# Patient Record
Sex: Male | Born: 1990 | Race: White | Hispanic: No | Marital: Single | Smoking: Former smoker
Health system: Southern US, Community
[De-identification: ages and names within clinical notes are randomized; demographics above are authoritative.]

## PROBLEM LIST (undated history)

## (undated) DIAGNOSIS — F1910 Other psychoactive substance abuse, uncomplicated: Secondary | ICD-10-CM

## (undated) DIAGNOSIS — S6990XA Unspecified injury of unspecified wrist, hand and finger(s), initial encounter: Secondary | ICD-10-CM

## (undated) DIAGNOSIS — L700 Acne vulgaris: Secondary | ICD-10-CM

## (undated) DIAGNOSIS — F17200 Nicotine dependence, unspecified, uncomplicated: Secondary | ICD-10-CM

## (undated) DIAGNOSIS — F411 Generalized anxiety disorder: Secondary | ICD-10-CM

## (undated) DIAGNOSIS — J309 Allergic rhinitis, unspecified: Secondary | ICD-10-CM

## (undated) DIAGNOSIS — K589 Irritable bowel syndrome without diarrhea: Secondary | ICD-10-CM

## (undated) DIAGNOSIS — B2790 Infectious mononucleosis, unspecified without complication: Secondary | ICD-10-CM

## (undated) DIAGNOSIS — K8020 Calculus of gallbladder without cholecystitis without obstruction: Secondary | ICD-10-CM

## (undated) DIAGNOSIS — N200 Calculus of kidney: Secondary | ICD-10-CM

## (undated) DIAGNOSIS — M5412 Radiculopathy, cervical region: Secondary | ICD-10-CM

## (undated) DIAGNOSIS — T7840XA Allergy, unspecified, initial encounter: Secondary | ICD-10-CM

## (undated) DIAGNOSIS — M5136 Other intervertebral disc degeneration, lumbar region: Secondary | ICD-10-CM

## (undated) DIAGNOSIS — J4530 Mild persistent asthma, uncomplicated: Secondary | ICD-10-CM

## (undated) HISTORY — DX: Acne vulgaris: L70.0

## (undated) HISTORY — DX: Nicotine dependence, unspecified, uncomplicated: F17.200

## (undated) HISTORY — DX: Generalized anxiety disorder: F41.1

## (undated) HISTORY — DX: Infectious mononucleosis, unspecified without complication: B27.90

## (undated) HISTORY — DX: Other intervertebral disc degeneration, lumbar region: M51.36

## (undated) HISTORY — DX: Irritable bowel syndrome, unspecified: K58.9

## (undated) HISTORY — DX: Allergy, unspecified, initial encounter: T78.40XA

## (undated) HISTORY — DX: Calculus of gallbladder without cholecystitis without obstruction: K80.20

## (undated) HISTORY — DX: Other psychoactive substance abuse, uncomplicated: F19.10

## (undated) HISTORY — DX: Allergic rhinitis, unspecified: J30.9

## (undated) HISTORY — DX: Radiculopathy, cervical region: M54.12

## (undated) HISTORY — DX: Mild persistent asthma, uncomplicated: J45.30

## (undated) HISTORY — DX: Unspecified injury of unspecified wrist, hand and finger(s), initial encounter: S69.90XA

## (undated) HISTORY — DX: Calculus of kidney: N20.0

## (undated) HISTORY — PX: LAPAROSCOPIC CHOLECYSTECTOMY: SUR755

---
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
## 2012-01-19 ENCOUNTER — Ambulatory Visit (INDEPENDENT_AMBULATORY_CARE_PROVIDER_SITE_OTHER): Payer: Managed Care, Other (non HMO) | Admitting: Family Medicine

## 2012-01-19 ENCOUNTER — Encounter: Payer: Self-pay | Admitting: Family Medicine

## 2012-01-19 VITALS — BP 118/78 | HR 71 | Temp 98.4°F | Ht 71.0 in | Wt 155.0 lb

## 2012-01-19 DIAGNOSIS — Z7689 Persons encountering health services in other specified circumstances: Secondary | ICD-10-CM | POA: Insufficient documentation

## 2012-01-19 DIAGNOSIS — Z23 Encounter for immunization: Secondary | ICD-10-CM

## 2012-01-19 DIAGNOSIS — Z7189 Other specified counseling: Secondary | ICD-10-CM

## 2012-01-19 NOTE — Addendum Note (Signed)
Addended by: Luisa Dago on: 01/19/2012 02:23 PM   Modules accepted: Orders

## 2012-01-19 NOTE — Assessment & Plan Note (Signed)
No acute problems. Discussed past diagnoses: stable.   Discussed warning signs of acute biliary colic--pt currently with asymptomatic gallstones.

## 2012-01-19 NOTE — Progress Notes (Signed)
Office Note 01/19/2012  CC:  Chief Complaint  Patient presents with  . Establish Care    TDAP, Flu shot    HPI:  Brian Mills. is a 21 y.o. White male who is here to establish care. Patient's most recent primary MD: [facility] in [place]. Old records in EPIC/HL were reviewed prior to or during today's visit.  He had his first episode of kidney stones 09/2011 and saw a urologist twice afterwards to get x-rays and document stone passage.  Urine strainer was given but nothing was caught.  No blood testing done.    On and off wrist extensor region pain, usually lasts a few days.  No swelling or redness or heat to wrists. Pt right handed.  Sometimes this happens on right and sometimes on left, never at same time. Sometimes gets shoulder pains, onset after he started lifting wts.   Past Medical History  Diagnosis Date  . Nephrolithiasis 09/2011    ED visit; left 3mm ureteral stone with slight hydroureter, right sided nonobstructing stone.  . Cholelithiasis     asymptomatic    History reviewed. No pertinent past surgical history.  Family History  Problem Relation Age of Onset  . Hypertension Maternal Grandfather   . Arthritis Maternal Grandfather     History   Social History  . Marital Status: Single    Spouse Name: N/A    Number of Children: N/A  . Years of Education: N/A   Occupational History  . Not on file.   Social History Main Topics  . Smoking status: Current Every Day Smoker  . Smokeless tobacco: Current User    Types: Chew  . Alcohol Use: Yes  . Drug Use: No  . Sexually Active: Not on file   Other Topics Concern  . Not on file   Social History Narrative   Single, no children.  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  +Dip.Alcohol: occ beer.No hx of drug use/abuse.    Outpatient Encounter Prescriptions as of 01/19/2012  Medication Sig Dispense Refill  . Nutritional  Supplements (HIGH-PROTEIN NUTRITIONAL SHAKE PO) Take by mouth daily.      Marland Kitchen DISCONTD: promethazine (PHENERGAN) 25 MG tablet Take 1 tablet (25 mg total) by mouth every 6 (six) hours as needed for nausea.  10 tablet  0  . DISCONTD: Tamsulosin HCl (FLOMAX) 0.4 MG CAPS Take 1 capsule (0.4 mg total) by mouth daily.  15 capsule  0    No Known Allergies  ROS Review of Systems  Constitutional: Negative for fever, chills, appetite change and fatigue.  HENT: Negative for ear pain, congestion, sore throat, neck stiffness and dental problem.   Eyes: Negative for discharge, redness and visual disturbance.  Respiratory: Negative for cough, chest tightness, shortness of breath and wheezing.   Cardiovascular: Negative for chest pain, palpitations and leg swelling.  Gastrointestinal: Negative for nausea, vomiting, abdominal pain, diarrhea and blood in stool.  Genitourinary: Negative for dysuria, urgency, frequency, hematuria, flank pain and difficulty urinating.  Musculoskeletal: Negative for myalgias, back pain, joint swelling and arthralgias.  Skin: Negative for pallor and rash.  Neurological: Negative for dizziness, speech difficulty, weakness and headaches.  Hematological: Negative for adenopathy. Does not bruise/bleed easily.  Psychiatric/Behavioral: Negative for confusion and disturbed wake/sleep cycle. The patient is not nervous/anxious.     PE; Blood pressure 118/78, pulse 71, temperature 98.4 F (36.9 C), temperature source Temporal, height 5\' 11"  (1.803 m), weight 155 lb (  70.308 kg), SpO2 99.00%. Gen: Alert, well appearing.  Patient is oriented to person, place, time, and situation. ZOX:WRUE: no injection, icteris, swelling, or exudate.  EOMI, PERRLA. Nose: no drainage or turbinate edema/swelling.  No injection or focal lesion.  Mouth: lips without lesion/swelling.  Oral mucosa pink and moist.  Dentition intact and without obvious caries or gingival swelling.  Oropharynx without erythema,  exudate, or swelling.  Neck - No masses or thyromegaly or limitation in range of motion CV: RRR, no m/r/g.   LUNGS: CTA bilat, nonlabored resps, good aeration in all lung fields. ABD: soft, NT, ND, BS normal.  No hepatospenomegaly or mass.  No bruits. EXT: no clubbing, cyanosis, or edema.   Pertinent labs:  None today  ASSESSMENT AND PLAN:   Encounter to establish care with new doctor No acute problems. Discussed past diagnoses: stable.   Discussed warning signs of acute biliary colic--pt currently with asymptomatic gallstones.  Tdap and flu vaccine given IM today.  An After Visit Summary was printed and given to the patient.  Return for at your convenience for CPE.

## 2012-06-18 ENCOUNTER — Emergency Department (HOSPITAL_COMMUNITY)
Admission: EM | Admit: 2012-06-18 | Discharge: 2012-06-18 | Disposition: A | Discharge status: Home / Self Care | Payer: Managed Care, Other (non HMO) | Attending: Emergency Medicine | Admitting: Emergency Medicine

## 2012-06-18 ENCOUNTER — Encounter (HOSPITAL_COMMUNITY): Payer: Self-pay | Admitting: *Deleted

## 2012-06-18 DIAGNOSIS — Z87442 Personal history of urinary calculi: Secondary | ICD-10-CM | POA: Insufficient documentation

## 2012-06-18 DIAGNOSIS — K439 Ventral hernia without obstruction or gangrene: Secondary | ICD-10-CM | POA: Insufficient documentation

## 2012-06-18 DIAGNOSIS — F17200 Nicotine dependence, unspecified, uncomplicated: Secondary | ICD-10-CM | POA: Insufficient documentation

## 2012-06-18 DIAGNOSIS — Z8719 Personal history of other diseases of the digestive system: Secondary | ICD-10-CM | POA: Insufficient documentation

## 2012-06-18 MED ORDER — HYDROCODONE-ACETAMINOPHEN 5-325 MG PO TABS: 1.0000 | ORAL_TABLET | ORAL | Status: DC | PRN

## 2012-06-18 MED ORDER — ONDANSETRON 4 MG PO TBDP
4.0000 mg | ORAL_TABLET | Freq: Once | ORAL | Status: AC
  Administered 2012-06-18: 4 mg via ORAL
  Filled 2012-06-18: qty 1

## 2012-06-18 MED ORDER — HYDROCODONE-ACETAMINOPHEN 5-325 MG PO TABS
1.0000 | ORAL_TABLET | Freq: Once | ORAL | Status: AC
  Administered 2012-06-18: 1 via ORAL
  Filled 2012-06-18: qty 1

## 2012-06-18 MED ORDER — ONDANSETRON HCL 4 MG PO TABS: 4.0000 mg | ORAL_TABLET | Freq: Four times a day (QID) | ORAL | Status: DC

## 2012-06-18 NOTE — ED Notes (Signed)
Patient is alert and oriented x3.  He was given DC instructions and follow up visit instructions.  Patient gave verbal understanding.  He was DC ambulatory under his own power to home.  V/S stable.  He was not showing any signs of distress on DC 

## 2012-06-18 NOTE — ED Provider Notes (Signed)
History     CSN: [id number]  Arrival date & time 06/18/12  0005   First MD Initiated Contact with Patient 06/18/12 0133      Chief Complaint  Patient presents with  . Abdominal Pain    (Consider location/radiation/quality/duration/timing/severity/associated sxs/prior treatment) HPI She presents to the emergency department for evaluation of abdominal pain. He says he was working out at the jam when he felt an immediate pop in his stomach went down to his knees. He says that he noticed a bulging coming out of the left abdominal wall about the size of golf ball. His friend told him to lie down flat. When EMS came to get him when they got him up the lump had gone away and most of the pain has significantly gone away. He came to the ED because he still having low back pain and he was not sure what was going on but thinks it was most likely a hernia. He says that this has never happened to him before. He is in no acute distress his vital signs are stable   Past Medical History  Diagnosis Date  . Nephrolithiasis 09/2011    ED visit; left 3mm ureteral stone with slight hydroureter, right sided nonobstructing stone.  . Cholelithiasis     asymptomatic    History reviewed. No pertinent past surgical history.  Family History  Problem Relation Age of Onset  . Hypertension Maternal Grandfather   . Arthritis Maternal Grandfather     History  Substance Use Topics  . Smoking status: Current Every Day Smoker  . Smokeless tobacco: Current User    Types: Chew  . Alcohol Use: Yes      Review of Systems  Review of Systems  Gen: no weight loss, fevers, chills, night sweats  Eyes: no discharge or drainage, no occular pain or visual changes  Nose: no epistaxis or rhinorrhea  Mouth: no dental pain, no sore throat  Neck: no neck pain  Lungs:No wheezing, coughing or hemoptysis CV: no chest pain, palpitations, dependent edema or orthopnea  Abd: + abdominal pain, no nausea, vomiting  GU: no  dysuria or gross hematuria  MSK:  No abnormalities  Neuro: no headache, no focal neurologic deficits  Skin: no abnormalities Psyche: negative.   Allergies  Review of patient's allergies indicates no known allergies.  Home Medications   Current Outpatient Rx  Name  Route  Sig  Dispense  Refill  . Nutritional Supplements (HIGH-PROTEIN NUTRITIONAL SHAKE PO)   Oral   Take 120 mLs by mouth 2 (two) times daily. Pre workout Shake         . HYDROcodone-acetaminophen (NORCO/VICODIN) 5-325 MG per tablet   Oral   Take 1 tablet by mouth every 4 (four) hours as needed for pain.   8 tablet   0   . ondansetron (ZOFRAN) 4 MG tablet   Oral   Take 1 tablet (4 mg total) by mouth every 6 (six) hours.   12 tablet   0     BP 156/82  Pulse 67  Temp(Src) 98.7 F (37.1 C)  Resp 20  SpO2 100%  Physical Exam  Nursing note and vitals reviewed. Constitutional: He appears well-developed and well-nourished. No distress.  HENT:  Head: Normocephalic and atraumatic.  Eyes: Pupils are equal, round, and reactive to light.  Neck: Normal range of motion. Neck supple.  Cardiovascular: Normal rate and regular rhythm.   Pulmonary/Chest: Effort normal.  Abdominal: Soft.    Neurological: He is alert.  Skin:  Skin is warm and dry.    ED Course  Procedures (including critical care time)  Labs Reviewed - No data to display No results found.   1. Abdominal wall hernia       MDM  Patient education given about hernias. Since the hernia is reduced and is no longer having significant pain there is no need for any intervention at this time. We have discussed that if the hernia continues to pop in and out frequently he will need an elective general surgical procedure to fix the hole or weakness in the abdominal wall. If the hernia comes out and he cannot push it back in or get anybody else does push it back in for him he's been told the danger of the tissue becoming incarcerated. He has been advised  that he needs to return to the emergency department as soon as possible to be evaluated by the EDP if he is unable to reduce the hernia.  The patient and his mom are here they have both understanding and both appear reliable. Vicodin given in the ER as well as a small prescription.  Pt has been advised of the symptoms that warrant their return to the ED. Patient has voiced understanding and has agreed to follow-up with the PCP or specialist.        Dorthula Matas, PA 06/18/12 [phone]

## 2012-06-18 NOTE — ED Provider Notes (Signed)
Medical screening examination/treatment/procedure(s) were performed by non-physician practitioner and as supervising physician I was immediately available for consultation/collaboration.   Lyanne Co, MD 06/18/12 [phone]

## 2012-06-18 NOTE — ED Notes (Signed)
Patient is alert and oriented x3.  He is complaining of mid left side abdominal pain that started During and gym workout.  He states that a spot in his left abdomen raised up and protruded from  His abdomen.  He was told it might be a hernia and EMS was called.  His mother arrived and brought  Him to the ED.  Currently he rates his pain 6 to 8 of 10.  He denies any nausea.  His abdomen is  Flat with pain on palpation

## 2012-06-18 NOTE — ED Notes (Signed)
Pt states was working out at gym and felt a hernia pop; states had a bulge to left of umbilicus; no bulging now; feels tight; minimal pain; no previous history

## 2012-11-16 ENCOUNTER — Encounter: Payer: Self-pay | Admitting: Family Medicine

## 2012-11-16 ENCOUNTER — Ambulatory Visit (INDEPENDENT_AMBULATORY_CARE_PROVIDER_SITE_OTHER): Payer: Managed Care, Other (non HMO) | Admitting: Family Medicine

## 2012-11-16 VITALS — BP 118/69 | HR 51 | Temp 98.6°F | Resp 16 | Ht 71.0 in | Wt 159.0 lb

## 2012-11-16 DIAGNOSIS — L700 Acne vulgaris: Secondary | ICD-10-CM | POA: Insufficient documentation

## 2012-11-16 DIAGNOSIS — L708 Other acne: Secondary | ICD-10-CM

## 2012-11-16 HISTORY — DX: Acne vulgaris: L70.0

## 2012-11-16 MED ORDER — ADAPALENE-BENZOYL PEROXIDE 0.1-2.5 % EX GEL: CUTANEOUS | Status: DC

## 2012-11-16 NOTE — Progress Notes (Signed)
OFFICE NOTE  11/16/2012  CC:  Chief Complaint  Patient presents with  . Acne     HPI: Patient is a 22 y.o. Caucasian male who is here for acne.  Notes a couple months hx of increased papules that look like acne on upper arms, sides of back, a smidge on neck and chest, and then a significant amount scattered on face diffusely on forehead, cheeks, chin mainly.  No large cystic lesions.  No itching or pain. He has applied nothing. Uses old spice for dry skin.     Pertinent PMH:  Past Medical History  Diagnosis Date  . Nephrolithiasis 09/2011    ED visit; left 3mm ureteral stone with slight hydroureter, right sided nonobstructing stone.  . Cholelithiasis     asymptomatic    MEDS:  NONE  PE: Blood pressure 118/69, pulse 51, temperature 98.6 F (37 C), temperature source Temporal, resp. rate 16, height 5\' 11"  (1.803 m), weight 159 lb (72.122 kg), SpO2 100.00%. Gen: Alert, well appearing.  Patient is oriented to person, place, time, and situation. SKIN: small flesh colored papular lesions--comedonal-- located diffusely on both upper arms, both sides of upper/mid back, upper anterior chest and all over forehead, cheeks, chin and a smidge on his neck.  IMPRESSION AND PLAN:  Acne vulgaris Epiduo (adapalene-benzoyl peroxide) 0.1-2.5% gel: apply nightly. Therapeutic expectations and side effect profile of medication discussed today.  Patient's questions answered. Use bland, hypoallergenic soap on these areas of his body.  An After Visit Summary was printed and given to the patient.  FOLLOW UP: prn

## 2012-11-16 NOTE — Assessment & Plan Note (Signed)
Epiduo (adapalene-benzoyl peroxide) 0.1-2.5% gel: apply nightly. Therapeutic expectations and side effect profile of medication discussed today.  Patient's questions answered. Use bland, hypoallergenic soap on these areas of his body.

## 2013-02-03 ENCOUNTER — Ambulatory Visit (INDEPENDENT_AMBULATORY_CARE_PROVIDER_SITE_OTHER): Payer: Managed Care, Other (non HMO)

## 2013-02-03 DIAGNOSIS — Z23 Encounter for immunization: Secondary | ICD-10-CM

## 2013-04-06 ENCOUNTER — Encounter: Payer: Self-pay | Admitting: Family Medicine

## 2013-04-06 ENCOUNTER — Ambulatory Visit (INDEPENDENT_AMBULATORY_CARE_PROVIDER_SITE_OTHER): Payer: Managed Care, Other (non HMO) | Admitting: Family Medicine

## 2013-04-06 VITALS — BP 137/88 | HR 58 | Temp 99.2°F | Resp 16 | Ht 71.0 in | Wt 152.0 lb

## 2013-04-06 DIAGNOSIS — F411 Generalized anxiety disorder: Secondary | ICD-10-CM | POA: Insufficient documentation

## 2013-04-06 DIAGNOSIS — L700 Acne vulgaris: Secondary | ICD-10-CM

## 2013-04-06 DIAGNOSIS — L708 Other acne: Secondary | ICD-10-CM

## 2013-04-06 HISTORY — DX: Generalized anxiety disorder: F41.1

## 2013-04-06 MED ORDER — CITALOPRAM HYDROBROMIDE 20 MG PO TABS: 20.0000 mg | ORAL_TABLET | Freq: Every day | ORAL | Status: DC

## 2013-04-06 NOTE — Progress Notes (Signed)
Pre visit review using our clinic review tool, if applicable. No additional management support is needed unless otherwise documented below in the visit note.  OFFICE NOTE  04/06/2013  CC:  Chief Complaint  Patient presents with  . Anxiety   HPI: Patient is a 22 y.o. Caucasian male who is here accompanied by his mother for anxiety. Probs sleeping, mind racing a lot and worrying. Daytime--feels overwhelmed and gets angered easily and will hit things and sometimes injure himself. Mom says Dad has hx of doing the same. Going on about a year.  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  NO depression.  Smoked pot some to self medicate but doesn't do that anymore.  No other drug use and no alcohol use.  Girl problems have been an issue.  No paranoia. Wound up all day.  Irritable.  Poor concentration.  Restless, esp at night.   Works out every night after work.  Bedtime: between 12 and 1.  Tries to get up same time every morning.  No middle insomnia.   Lives at home with mom, dad, and sister--says all relationships fine there.  ROS: occ migraine HAs.  No stomach aches, constip, or diarrhea.  Pertinent PMH:  Past Medical History  Diagnosis Date  . Nephrolithiasis 09/2011    ED visit; left 3mm ureteral stone with slight hydroureter, right sided nonobstructing stone.  . Cholelithiasis     asymptomatic   Past surgical, social, and family history reviewed and no changes noted since last office visit.  MEDS:  Melatonin nightly, 1/2 of 3 mg tab PE: Blood pressure 137/88, pulse 58, temperature 99.2 F (37.3 C), temperature source Temporal, resp. rate 16, height 5\' 11"  (1.803 m), weight 152 lb (68.947 kg), SpO2 97.00%. Wt Readings from Last 2 Encounters:  04/06/13 152 lb (68.947 kg)  11/16/12 159 lb (72.122 kg)    Gen: alert, oriented x 4, affect pleasant.  Lucid thinking and conversation noted. HEENT: PERRLA, EOMI.   Neck: no LAD, mass, or thyromegaly. CV: RRR, no m/r/g LUNGS: CTA bilat,  nonlabored. NEURO: no tremor or tics noted on observation.  Coordination intact. CN 2-12 grossly intact bilaterally, strength 5/5 in all extremeties.  No ataxia.   IMPRESSION AND PLAN:  Generalized anxiety disorder Start citalopram 20mg  qd.  Therapeutic expectations and side effect profile of medication discussed today.  Patient's questions answered.    An After Visit Summary was printed and given to the patient.  FOLLOW UP: 51mo

## 2013-04-06 NOTE — Assessment & Plan Note (Signed)
Start citalopram 20mg qd.  Therapeutic expectations and side effect profile of medication discussed today.  Patient's questions answered.  

## 2013-05-09 ENCOUNTER — Encounter: Payer: Self-pay | Admitting: Family Medicine

## 2013-05-09 ENCOUNTER — Ambulatory Visit (INDEPENDENT_AMBULATORY_CARE_PROVIDER_SITE_OTHER): Payer: BC Managed Care – PPO | Admitting: Family Medicine

## 2013-05-09 VITALS — BP 116/74 | HR 62 | Temp 97.8°F | Resp 16 | Ht 71.0 in | Wt 154.0 lb

## 2013-05-09 DIAGNOSIS — F411 Generalized anxiety disorder: Secondary | ICD-10-CM

## 2013-05-09 DIAGNOSIS — F17200 Nicotine dependence, unspecified, uncomplicated: Secondary | ICD-10-CM

## 2013-05-09 HISTORY — DX: Nicotine dependence, unspecified, uncomplicated: F17.200

## 2013-05-09 MED ORDER — CITALOPRAM HYDROBROMIDE 20 MG PO TABS: 20.0000 mg | ORAL_TABLET | Freq: Every day | ORAL | Status: DC

## 2013-05-09 NOTE — Progress Notes (Signed)
Pre visit review using our clinic review tool, if applicable. No additional management support is needed unless otherwise documented below in the visit note. 

## 2013-05-09 NOTE — Progress Notes (Signed)
OFFICE NOTE  05/09/2013  CC:  Chief Complaint  Patient presents with  . Follow-up     HPI: Patient is a 23 y.o. Caucasian male who is here for 1 mo f/u anxiety.  Started citalopram 20mg  qd last visit. Says mood is more stable, gets upset less easily.  Calmer, has a sense of overall well being.  Not arguing with his girlfriend nearly as much. Sleep is improving--was having some initial insomnia. Some stomach ache and loose stools for about the first week after starting med---this spontaneously resolved.  Takes it at bedtime. Still smoking, not currently contemplating a trial of quitting. No marijuana or other drug use since I last saw him.  Pertinent PMH:  Past Medical History  Diagnosis Date  . Nephrolithiasis 09/2011    ED visit; left 3mm ureteral stone with slight hydroureter, right sided nonobstructing stone.  . Cholelithiasis     asymptomatic  . Acne vulgaris 11/16/2012    Adapalene -benz perox helped clear this up     MEDS:  Outpatient Prescriptions Prior to Visit  Medication Sig Dispense Refill  . citalopram (CELEXA) 20 MG tablet Take 1 tablet (20 mg total) by mouth daily.  30 tablet  1  . Adapalene-Benzoyl Peroxide (EPIDUO) 0.1-2.5 % gel Apply to affected areas every night  45 g  3   No facility-administered medications prior to visit.    PE: Blood pressure 116/74, pulse 62, temperature 97.8 F (36.6 C), temperature source Temporal, resp. rate 16, height 5\' 11"  (1.803 m), weight 154 lb (69.854 kg), SpO2 97.00%. Wt Readings from Last 2 Encounters:  05/09/13 154 lb (69.854 kg)  04/06/13 152 lb (68.947 kg)    Gen: alert, oriented x 4, affect pleasant.  Lucid thinking and conversation noted. HEENT: PERRLA, EOMI.   Neck: no LAD, mass, or thyromegaly. CV: RRR, no m/r/g LUNGS: CTA bilat, nonlabored. NEURO: no tremor or tics noted on observation.  Coordination intact. CN 2-12 grossly intact bilaterally, strength 5/5 in all extremeties.  No ataxia.   IMPRESSION AND  PLAN:  Generalized anxiety disorder Significant improvement/response to citalopram 20mg  qd. Continue current dosing, 90 day supply rx'd to local pharmacy today. Discussed general plan of remaining on this med for minimum of 9-12 mo and he voiced understanding and agreement.   Tobacco dependence: not contemplating quitting. He admitted his main trigger is stress, so hopefully in the near future as stress/anxiety improves he may want to contemplate trial of quitting.  An After Visit Summary was printed and given to the patient.  FOLLOW UP: 3 mo

## 2013-05-09 NOTE — Assessment & Plan Note (Signed)
Significant improvement/response to citalopram 20mg  qd. Continue current dosing, 90 day supply rx'd to local pharmacy today. Discussed general plan of remaining on this med for minimum of 9-12 mo and he voiced understanding and agreement.

## 2013-05-25 ENCOUNTER — Telehealth: Payer: Self-pay | Admitting: Family Medicine

## 2013-05-25 NOTE — Telephone Encounter (Signed)
Relevant patient education assigned to patient using Emmi. ° °

## 2013-08-08 ENCOUNTER — Ambulatory Visit (INDEPENDENT_AMBULATORY_CARE_PROVIDER_SITE_OTHER): Payer: BC Managed Care – PPO | Admitting: Family Medicine

## 2013-08-08 ENCOUNTER — Encounter: Payer: Self-pay | Admitting: Family Medicine

## 2013-08-08 VITALS — BP 128/76 | HR 58 | Temp 99.0°F | Resp 18 | Ht 71.0 in | Wt 153.0 lb

## 2013-08-08 DIAGNOSIS — F411 Generalized anxiety disorder: Secondary | ICD-10-CM

## 2013-08-08 DIAGNOSIS — F17200 Nicotine dependence, unspecified, uncomplicated: Secondary | ICD-10-CM

## 2013-08-08 MED ORDER — CITALOPRAM HYDROBROMIDE 20 MG PO TABS: 20.0000 mg | ORAL_TABLET | Freq: Every day | ORAL | Status: DC

## 2013-08-08 NOTE — Progress Notes (Signed)
Pre visit review using our clinic review tool, if applicable. No additional management support is needed unless otherwise documented below in the visit note. 

## 2013-08-08 NOTE — Progress Notes (Signed)
OFFICE NOTE  08/08/2013  CC:  Chief Complaint  Patient presents with  . Follow-up     HPI: Patient is a 23 y.o. Caucasian male who is here for 3 mo f/u for anxiety. Has been on citalopram for 4 mo now.   Doing well, no complaints.  Feels like his anxiety level is "normal" now.  No panic, no anger issues, no sleep problems.  Has been working as Museum/gallery exhibitions officerforklift driver still. Takes meds with about 90% compliance.  Denies side effect from med. Still smoking but cutting back, not craving them as much, using "vap" e-cig occasionally.  No new c/o today.  Pertinent PMH:  Past Medical History  Diagnosis Date  . Nephrolithiasis 09/2011    ED visit; left 3mm ureteral stone with slight hydroureter, right sided nonobstructing stone.  . Cholelithiasis     asymptomatic  . Acne vulgaris 11/16/2012    Adapalene -benz perox helped clear this up   . Generalized anxiety disorder 04/06/2013  . Tobacco dependence 05/09/2013   PSH reviewed, social hx reviewed.  MEDS: Not requiring any Epiduo listed below Outpatient Prescriptions Prior to Visit  Medication Sig Dispense Refill  . citalopram (CELEXA) 20 MG tablet Take 1 tablet (20 mg total) by mouth daily.  90 tablet  0  . Adapalene-Benzoyl Peroxide (EPIDUO) 0.1-2.5 % gel Apply to affected areas every night  45 g  3   No facility-administered medications prior to visit.    PE: Blood pressure 128/76, pulse 58, temperature 99 F (37.2 C), temperature source Temporal, resp. rate 18, height 5\' 11"  (1.803 m), weight 153 lb (69.4 kg), SpO2 97.00%. Wt Readings from Last 2 Encounters:  08/08/13 153 lb (69.4 kg)  05/09/13 154 lb (69.854 kg)    Gen: alert, oriented x 4, affect pleasant.  Lucid thinking and conversation noted. HEENT: PERRLA, EOMI.   Neck: no LAD, mass, or thyromegaly. CV: RRR, no m/r/g LUNGS: CTA bilat, nonlabored. NEURO: no tremor or tics noted on observation.  Coordination intact. CN 2-12 grossly intact bilaterally, strength 5/5 in all  extremeties.  No ataxia.   IMPRESSION AND PLAN:  GAD, in remission on citalopram 20mg  qd. Continue current med/dosing.  Ongoing tob abuse, cutting back, declines any further med assistance with quitting at this time. Call or return for problems.  An After Visit Summary was printed and given to the patient.  FOLLOW UP: 6 mo

## 2013-08-09 ENCOUNTER — Telehealth: Payer: Self-pay | Admitting: Family Medicine

## 2013-08-09 NOTE — Telephone Encounter (Signed)
Relevant patient education assigned to patient using Emmi. ° °

## 2013-10-17 ENCOUNTER — Encounter: Payer: Self-pay | Admitting: Family Medicine

## 2013-10-17 ENCOUNTER — Ambulatory Visit (INDEPENDENT_AMBULATORY_CARE_PROVIDER_SITE_OTHER): Payer: BC Managed Care – PPO | Admitting: Family Medicine

## 2013-10-17 VITALS — BP 129/76 | HR 61 | Temp 98.0°F | Resp 16 | Ht 71.0 in | Wt 155.0 lb

## 2013-10-17 DIAGNOSIS — J209 Acute bronchitis, unspecified: Secondary | ICD-10-CM

## 2013-10-17 MED ORDER — PREDNISONE 20 MG PO TABS: ORAL_TABLET | ORAL | Status: DC

## 2013-10-17 NOTE — Progress Notes (Signed)
OFFICE NOTE  10/17/2013  CC:  Chief Complaint  Patient presents with  . Nasal Congestion  . Cough     HPI: Patient is a 23 y.o. Caucasian male who is here for about 10d URI sx's, cough. Worse this morning: coughing "about an hour straight". Denies hx of seasonal allergies.  Nose really stopped up, some HA, no ST unless coughs a lot. Says he has some sensation of SOB/wheezing.  No fever.  Tussin CF no help.  OTC zyrtec no help. Tired and achy.   No n/v/d.  No rash. Still smoking cigs.  Pertinent PMH:  Past medical, surgical, social, and family history reviewed and no changes are noted since last office visit.  MEDS:  Outpatient Prescriptions Prior to Visit  Medication Sig Dispense Refill  . citalopram (CELEXA) 20 MG tablet Take 1 tablet (20 mg total) by mouth daily.  90 tablet  1   No facility-administered medications prior to visit.    PE: Blood pressure 129/76, pulse 61, temperature 98 F (36.7 C), temperature source Oral, resp. rate 16, height 5\' 11"  (1.803 m), weight 155 lb (70.308 kg), SpO2 98.00%. VS: noted--normal. Gen: alert, NAD, NONTOXIC APPEARING. HEENT: eyes without injection, drainage, or swelling.  Ears: EACs clear, TMs with normal light reflex and landmarks.  Nose: Clear rhinorrhea, with some dried, crusty exudate adherent to mildly injected mucosa.  No purulent d/c.  No paranasal sinus TTP.  No facial swelling.  Throat and mouth without focal lesion.  No pharyngial swelling, erythema, or exudate.   Neck: supple, no LAD.   LUNGS: CTA bilat, nonlabored resps.   CV: RRR, no m/r/g. EXT: no c/c/e SKIN: no rash    IMPRESSION AND PLAN:  Acute bronchitis, suspect viral etiology. Tobacco dependence complicates this. Encouraged smoking cessation. Prednisone 40mg  qd x 5d. Mucinex DM OTC.  An After Visit Summary was printed and given to the patient.  FOLLOW UP: prn

## 2013-10-17 NOTE — Patient Instructions (Signed)
Mucinex DM OTC tabs for cough--as directed on box.

## 2013-10-17 NOTE — Progress Notes (Signed)
Pre visit review using our clinic review tool, if applicable. No additional management support is needed unless otherwise documented below in the visit note. 

## 2013-10-20 ENCOUNTER — Telehealth: Payer: Self-pay | Admitting: Family Medicine

## 2013-10-20 MED ORDER — HYDROCODONE-HOMATROPINE 5-1.5 MG/5ML PO SYRP: ORAL_SOLUTION | ORAL | Status: DC

## 2013-10-20 NOTE — Telephone Encounter (Signed)
Patient Information:  Caller Name: Tresa EndoKelly  Phone: [phone]  Patient: Brian Mills, Jr., Brian Mills  Gender: Male  DOB: [birth year]  Age: [age] Years  PCP: Earley FavorMcGowen, Phillip [facility](Family Practice)  Office Follow Up:  Does the office need to follow up with this patient?: Yes  Instructions For The Office: Please advise if able to prescribe a cough syrup with codeine for this pt. who is unable to sleep at night due to continuous coughing. They were just in on 10/17/13.  RN Note:  Mother is calling on behalf of the pt. to see if he can be given a cough syrup with codeine to help with the cough at night. Unable to get any sleep. Coughing all night. Please advise.  Symptoms  Reason For Call & Symptoms: Saw Dr. Boyd KerbsMc Gowen for a cough on 10/17/13. Diagnosed with Bronchitis. Using Mucinex DM.Pt. given a steroid. Coughing continuously at night and unable to rest.  Reviewed Health History In EMR: Yes  Reviewed Medications In EMR: Yes  Reviewed Allergies In EMR: Yes  Reviewed Surgeries / Procedures: Yes  Date of Onset of Symptoms: 10/10/2013  Treatments Tried: Mucinex DM and Steroids  Treatments Tried Worked: No  Guideline(s) Used:  Cough  Disposition Per Guideline:   See Today or Tomorrow in Office  Reason For Disposition Reached:   Continuous (nonstop) coughing interferes with work or school and no improvement using cough treatment per Care Advice  Advice Given:  Coughing Spasms:  Drink warm fluids. Inhale warm mist (Reason: both relax the airway and loosen up the phlegm).  Suck on cough drops or hard candy to coat the irritated throat.  Prevent Dehydration:  Drink adequate liquids.  This will help soothe an irritated or dry throat and loosen up the phlegm.  Call Back If:  Difficulty breathing  You become worse.  Patient Will Follow Care Advice:  YES

## 2013-10-20 NOTE — Telephone Encounter (Signed)
Please advise 

## 2013-10-20 NOTE — Telephone Encounter (Signed)
Rx is up front ready for pick up.  Patient's mom aware.

## 2013-10-20 NOTE — Telephone Encounter (Signed)
Hycodan rx printed for pt to pick up.

## 2013-11-28 ENCOUNTER — Ambulatory Visit (INDEPENDENT_AMBULATORY_CARE_PROVIDER_SITE_OTHER): Payer: BC Managed Care – PPO | Admitting: Sports Medicine

## 2013-11-28 ENCOUNTER — Encounter: Payer: Self-pay | Admitting: Sports Medicine

## 2013-11-28 ENCOUNTER — Ambulatory Visit (INDEPENDENT_AMBULATORY_CARE_PROVIDER_SITE_OTHER): Payer: BC Managed Care – PPO

## 2013-11-28 VITALS — BP 117/68 | HR 62 | Ht 71.0 in | Wt 158.0 lb

## 2013-11-28 DIAGNOSIS — M79609 Pain in unspecified limb: Secondary | ICD-10-CM

## 2013-11-28 DIAGNOSIS — M5412 Radiculopathy, cervical region: Secondary | ICD-10-CM

## 2013-11-28 DIAGNOSIS — M542 Cervicalgia: Secondary | ICD-10-CM

## 2013-11-28 MED ORDER — MELOXICAM 15 MG PO TABS: ORAL_TABLET | ORAL | Status: DC

## 2013-11-28 MED ORDER — PREDNISONE 50 MG PO TABS: ORAL_TABLET | ORAL | Status: DC

## 2013-11-28 NOTE — Progress Notes (Signed)
   Subjective:    I'm seeing this patient as a consultation for:  Dr. Horatio PelPhilip McGowan  CC: Numbness over right shoulder  HPI: Paitient is a previously healthy 23 year old male who comes to the clinic today with several weeks of numbness over the right shoulder and going down a little ways into his upper arm roughly overtop of his triceps. The numbness began without inciting incident and has been unchanged since starting. He notes no precipitating or relieving factors. He denies pain, tingling, burning or numbness other than stated. He says that his neck does not bother him. Symptoms are moderate, persistent. No constitutional symptoms, no trauma.  Past medical history, Surgical history, Family history not pertinant except as noted below, Social history, Allergies, and medications have been entered into the medical record, reviewed, and no changes needed.   Review of Systems: No headache, visual changes, nausea, vomiting, diarrhea, constipation, dizziness, abdominal pain, skin rash, fevers, chills, night sweats, weight loss, swollen lymph nodes, body aches, joint swelling, muscle aches, chest pain, shortness of breath, mood changes, visual or auditory hallucinations.   Objective:   General: Well Developed, well nourished, and in no acute distress.  Neuro/Psych: Alert and oriented x3, extra-ocular muscles intact, able to move all 4 extremities, sensation grossly intact. Skin: Warm and dry, no rashes noted.  Respiratory: Not using accessory muscles, speaking in full sentences, trachea midline.  Cardiovascular: Pulses palpable, no extremity edema. Abdomen: Does not appear distended. Neck: Inspection unremarkable. No palpable stepoffs. Positive Spurling's maneuver. Full neck range of motion Grip strength and sensation normal in bilateral hands Strength good C4 to T1 distribution Reflexes normal Left Shoulder: Inspection reveals no abnormalities, atrophy or asymmetry. Palpation is normal with  no tenderness over AC joint or bicipital groove. ROM is full in all planes. Rotator cuff strength normal throughout; patient notes pulling sensation in anterior shoulder with lift off on left side. No signs of impingement with negative Neer and Hawkin's tests, empty can sign. Speeds and Yergason's tests normal. No labral pathology noted with negative Obrien's, negative clunk and good stability. Normal scapular function observed. No painful arc and no drop arm sign. No apprehension sign  Cervical spine x-rays reviewed and do show some straightening of the normal cervical lordosis suggestive of spasm.  Impression and Recommendations:    Presentation is most consistent with compression of the C5 nerve root. Patient was sent for physical therapy and x-ray. Additionally, we will be prescribing prednisone and meloxicam.   This case required medical decision making of moderate complexity.

## 2013-11-28 NOTE — Assessment & Plan Note (Signed)
Left C5. Prednisone, Mobic, PT, x-rays. Return in one month, MRI if no better.

## 2013-12-06 ENCOUNTER — Encounter: Payer: BC Managed Care – PPO | Admitting: Sports Medicine

## 2013-12-27 ENCOUNTER — Ambulatory Visit: Payer: BC Managed Care – PPO | Admitting: Sports Medicine

## 2013-12-29 ENCOUNTER — Encounter: Payer: Self-pay | Admitting: Sports Medicine

## 2013-12-29 ENCOUNTER — Ambulatory Visit (INDEPENDENT_AMBULATORY_CARE_PROVIDER_SITE_OTHER): Payer: BC Managed Care – PPO | Admitting: Sports Medicine

## 2013-12-29 VITALS — BP 125/80 | HR 53 | Ht 71.0 in | Wt 150.0 lb

## 2013-12-29 DIAGNOSIS — M5412 Radiculopathy, cervical region: Secondary | ICD-10-CM

## 2013-12-29 NOTE — Assessment & Plan Note (Signed)
70% improved with Mobic, formal physical therapy, prednisone. Continue PT, return in a month, MRI if no better.

## 2013-12-29 NOTE — Progress Notes (Signed)
  Subjective:    CC: Followup  HPI: Left cervical radiculitis: Continues to improve with physical therapy, medications, happy with results so far does not desire to pursue a more advanced imaging studies at this point.  Past medical history, Surgical history, Family history not pertinant except as noted below, Social history, Allergies, and medications have been entered into the medical record, reviewed, and no changes needed.   Review of Systems: No fevers, chills, night sweats, weight loss, chest pain, or shortness of breath.   Objective:    General: Well Developed, well nourished, and in no acute distress.  Neuro: Alert and oriented x3, extra-ocular muscles intact, sensation grossly intact.  HEENT: Normocephalic, atraumatic, pupils equal round reactive to light, neck supple, no masses, no lymphadenopathy, thyroid nonpalpable.  Skin: Warm and dry, no rashes. Cardiac: Regular rate and rhythm, no murmurs rubs or gallops, no lower extremity edema.  Respiratory: Clear to auscultation bilaterally. Not using accessory muscles, speaking in full sentences. Neck: Negative spurling's Full neck range of motion Grip strength and sensation normal in bilateral hands Strength good C4 to T1 distribution No sensory change to C4 to T1 Reflexes normal  Impression and Recommendations:

## 2014-01-26 ENCOUNTER — Ambulatory Visit: Payer: BC Managed Care – PPO | Admitting: Sports Medicine

## 2014-01-31 ENCOUNTER — Ambulatory Visit (INDEPENDENT_AMBULATORY_CARE_PROVIDER_SITE_OTHER): Payer: BC Managed Care – PPO | Admitting: Sports Medicine

## 2014-01-31 ENCOUNTER — Encounter: Payer: Self-pay | Admitting: Sports Medicine

## 2014-01-31 ENCOUNTER — Telehealth: Payer: Self-pay | Admitting: *Deleted

## 2014-01-31 VITALS — BP 127/72 | HR 50 | Ht 71.0 in | Wt 155.0 lb

## 2014-01-31 DIAGNOSIS — M5412 Radiculopathy, cervical region: Secondary | ICD-10-CM

## 2014-01-31 NOTE — Telephone Encounter (Signed)
No prior auth required as per Sheran LawlessMadeline I @ [employer]. Radiology notified. Corliss SkainsJamie Damire Remedios, CMA

## 2014-01-31 NOTE — Assessment & Plan Note (Signed)
Persistent radicular symptoms despite Mobic and physical therapy. MRI, return to go over results, and discuss either interventional injection planning or nerve conduction study.

## 2014-01-31 NOTE — Progress Notes (Signed)
  Subjective:    CC: Followup  HPI: Left cervical radiculitis: C5 periscapular, has been through 8 weeks of physical therapy, steroids, NSAIDs. Continues to have symptoms. Moderate persistent. At this point he is amenable to proceeding with advanced imaging.  Past medical history, Surgical history, Family history not pertinant except as noted below, Social history, Allergies, and medications have been entered into the medical record, reviewed, and no changes needed.   Review of Systems: No fevers, chills, night sweats, weight loss, chest pain, or shortness of breath.   Objective:    General: Well Developed, well nourished, and in no acute distress.  Neuro: Alert and oriented x3, extra-ocular muscles intact, sensation grossly intact.  HEENT: Normocephalic, atraumatic, pupils equal round reactive to light, neck supple, no masses, no lymphadenopathy, thyroid nonpalpable.  Skin: Warm and dry, no rashes. Cardiac: Regular rate and rhythm, no murmurs rubs or gallops, no lower extremity edema.  Respiratory: Clear to auscultation bilaterally. Not using accessory muscles, speaking in full sentences. Neck: Negative spurling's Full neck range of motion Grip strength and sensation normal in bilateral hands Strength good C4 to T1 distribution No sensory change to C4 to T1 Reflexes normal  Impression and Recommendations:

## 2014-02-06 ENCOUNTER — Ambulatory Visit (INDEPENDENT_AMBULATORY_CARE_PROVIDER_SITE_OTHER): Payer: BC Managed Care – PPO

## 2014-02-06 DIAGNOSIS — M5412 Radiculopathy, cervical region: Secondary | ICD-10-CM

## 2014-02-06 DIAGNOSIS — M5022 Other cervical disc displacement, mid-cervical region: Secondary | ICD-10-CM

## 2014-02-13 ENCOUNTER — Encounter: Payer: Self-pay | Admitting: Sports Medicine

## 2014-02-13 ENCOUNTER — Ambulatory Visit (INDEPENDENT_AMBULATORY_CARE_PROVIDER_SITE_OTHER): Payer: BC Managed Care – PPO | Admitting: Sports Medicine

## 2014-02-13 VITALS — BP 127/75 | HR 58 | Ht 71.0 in | Wt 156.0 lb

## 2014-02-13 DIAGNOSIS — M5412 Radiculopathy, cervical region: Secondary | ICD-10-CM | POA: Diagnosis not present

## 2014-02-13 DIAGNOSIS — Z23 Encounter for immunization: Secondary | ICD-10-CM | POA: Diagnosis not present

## 2014-02-13 MED ORDER — GABAPENTIN 300 MG PO CAPS: ORAL_CAPSULE | ORAL | Status: DC

## 2014-02-13 NOTE — Progress Notes (Signed)
  Subjective:    CC: MRI results  HPI: This is a very pleasant 23 year old male, he comes in with initially left sided periscapular radicular symptoms and now right sided periscapular radicular symptoms, we put him through rehabilitation, steroids, anti-inflammatories, more recently we obtained an MRI of his cervical spine the results of which will be dictated below. Periscapular symptoms are in a T1 distribution and wrap around the axilla, worse on the right side now and worse with abduction of his right shoulder. No symptoms into the hands.  Past medical history, Surgical history, Family history not pertinant except as noted below, Social history, Allergies, and medications have been entered into the medical record, reviewed, and no changes needed.   Review of Systems: No fevers, chills, night sweats, weight loss, chest pain, or shortness of breath.   Objective:    General: Well Developed, well nourished, and in no acute distress.  Neuro: Alert and oriented x3, extra-ocular muscles intact, sensation grossly intact.  HEENT: Normocephalic, atraumatic, pupils equal round reactive to light, neck supple, no masses, no lymphadenopathy, thyroid nonpalpable.  Skin: Warm and dry, no rashes. Cardiac: Regular rate and rhythm, no murmurs rubs or gallops, no lower extremity edema.  Respiratory: Clear to auscultation bilaterally. Not using accessory muscles, speaking in full sentences. Neck: Negative spurling's Full neck range of motion Grip strength and sensation normal in bilateral hands Strength good C4 to T1 distribution No sensory change to C4 to T1 Reflexes normal Right Shoulder: Inspection reveals no abnormalities, atrophy or asymmetry. Palpation is normal with no tenderness over AC joint or bicipital groove. ROM is full in all planes. Rotator cuff strength normal throughout. No signs of impingement with negative Neer and Hawkin's tests, empty can. Speeds and Yergason's tests normal. No  labral pathology noted with negative Obrien's, negative crank, negative clunk, and good stability. Normal scapular function observed. No painful arc and no drop arm sign. No apprehension sign  MRI does show C6-C7 very small disc protrusion, with mild desiccation, it does not appear to intent the thecal sac or cause any nerve root compromise.  Impression and Recommendations:

## 2014-02-13 NOTE — Assessment & Plan Note (Signed)
Bilateral radicular symptoms, on the right side currently in a T1 type distribution. MRI does show a C6-C7 disc protrusion that is very small, it does not appear to indent the thecal sac, and it does not appear to come in contact with the exiting ventral nerve root. At this point we are going to add gabapentin, and proceed with a nerve conduction study for further elucidation of symptoms. Paresthesias are predominantly in the right axilla.

## 2014-02-19 DIAGNOSIS — M5412 Radiculopathy, cervical region: Secondary | ICD-10-CM

## 2014-02-19 HISTORY — DX: Radiculopathy, cervical region: M54.12

## 2014-02-21 ENCOUNTER — Other Ambulatory Visit: Payer: Self-pay | Admitting: Family Medicine

## 2014-02-21 MED ORDER — CITALOPRAM HYDROBROMIDE 20 MG PO TABS: 20.0000 mg | ORAL_TABLET | Freq: Every day | ORAL | Status: DC

## 2014-02-23 ENCOUNTER — Encounter: Payer: Self-pay | Admitting: Family Medicine

## 2014-02-23 ENCOUNTER — Ambulatory Visit (INDEPENDENT_AMBULATORY_CARE_PROVIDER_SITE_OTHER): Payer: BC Managed Care – PPO | Admitting: Family Medicine

## 2014-02-23 VITALS — BP 108/69 | HR 77 | Temp 99.0°F | Resp 18 | Ht 71.0 in | Wt 151.0 lb

## 2014-02-23 DIAGNOSIS — J029 Acute pharyngitis, unspecified: Secondary | ICD-10-CM

## 2014-02-23 MED ORDER — HYDROCODONE-ACETAMINOPHEN 5-325 MG PO TABS: ORAL_TABLET | ORAL | Status: DC

## 2014-02-23 NOTE — Progress Notes (Signed)
Pre visit review using our clinic review tool, if applicable. No additional management support is needed unless otherwise documented below in the visit note. 

## 2014-02-23 NOTE — Progress Notes (Signed)
OFFICE NOTE  02/23/2014  CC:  Chief Complaint  Patient presents with  . Sore Throat    x yesterday  . Headache   HPI: Patient is a 23 y.o. Caucasian male who is here for sore throat and headache. Onset yesterday, ST, HA, body achy, subjective fever/chills.  No rash.  No nasal sx's or cough.  No OTC meds tried.  No n/v/d.    Pertinent PMH:  Past medical, surgical, social, and family history reviewed and no changes are noted since last office visit.  MEDS:  Outpatient Prescriptions Prior to Visit  Medication Sig Dispense Refill  . citalopram (CELEXA) 20 MG tablet Take 1 tablet (20 mg total) by mouth daily. 90 tablet 0  . gabapentin (NEURONTIN) 300 MG capsule One tab PO qHS for a week, then BID for a week, then TID. May double weekly to a max of 3,600mg /day 90 capsule 3   No facility-administered medications prior to visit.    PE: Blood pressure 108/69, pulse 77, temperature 99 F (37.2 C), temperature source Temporal, resp. rate 18, height 5\' 11"  (1.803 m), weight 151 lb (68.493 kg), SpO2 97 %. Gen: alert, tired appearing but NAD and nontoxic. Nose clear, TMs normal.  Eyes w/out erythema, exudate, or swelling. Lips/tongue normal. Pharynx and soft palate are showing mild diffuse erythema but no swelling or exudate. Neck: + diffuse ant cerv LAD, nontender.  No post cerv nodes. CV: RRR, no m/r/g.   LUNGS: CTA bilat, nonlabored resps, good aeration in all lung fields. Skin - no sores or suspicious lesions or rashes or color changes  LAB: rapid strep NEG  IMPRESSION AND PLAN:  Acute pharyngitis, suspect viral etiology. Sent group A strep culture swab. Vicodin 5/325, 1-2 q6h prn pain, 30.  Therapeutic expectations and side effect profile of medication discussed today.  Patient's questions answered. Salt water gargle and/or chloraseptic spray/lozenges recommended. He declined a note for work excuse today.  An After Visit Summary was printed and given to the patient.  FOLLOW  UP: prn

## 2014-02-24 ENCOUNTER — Telehealth: Payer: Self-pay | Admitting: Family Medicine

## 2014-02-24 NOTE — Telephone Encounter (Signed)
Pt's mom called stating that pt is still having headaches and his throat is still sore, he's also having chills and then he's hot.  Patient has now missed 3 days of work and wondered if there was anything he could do until his strep culture came back?  Please advise.

## 2014-02-25 LAB — CULTURE, GROUP A STREP: Organism ID, Bacteria: NORMAL

## 2014-03-02 ENCOUNTER — Ambulatory Visit (INDEPENDENT_AMBULATORY_CARE_PROVIDER_SITE_OTHER): Payer: BC Managed Care – PPO | Admitting: Neurology

## 2014-03-02 ENCOUNTER — Ambulatory Visit (INDEPENDENT_AMBULATORY_CARE_PROVIDER_SITE_OTHER): Payer: Self-pay

## 2014-03-02 DIAGNOSIS — M5010 Cervical disc disorder with radiculopathy, unspecified cervical region: Secondary | ICD-10-CM

## 2014-03-02 DIAGNOSIS — R202 Paresthesia of skin: Secondary | ICD-10-CM

## 2014-03-02 DIAGNOSIS — Z0289 Encounter for other administrative examinations: Secondary | ICD-10-CM

## 2014-03-02 NOTE — Progress Notes (Signed)
  [facility]    Provider:  Dr Lucia GaskinsAhern Referring Provider: Jeoffrey MassedMcGowen, Philip H, MD Primary Care Physician:  Jeoffrey MassedMCGOWEN,PHILIP H, MD  History:  Brian Rubensteinodney Burkhammer Jr. is a 23 y.o. male here as a referral from Dr. Milinda CaveMcGowen for paresthesias. Patient endorses numbness proximally in the left arm with tingling slightly anterior to the deltoid(C5/C6 dermatomes). Happened several month ago. Stable. No neck pain. No weakness. Woke up with the pain. Strength is 5/5 including infraspinatus,supraspinatus,intrinsic hand muscles.   Summary  Nerve conduction studies were performed on the bilateral upper extremities:  The bilateral Median motor nerves showed normal conductions with normal F Wave latency The bilateral Ulnar motor nerves showed normal conductions with normal F Wave latency The right Radial motor nerve showed conduction block across Erb's point  The left Radial motor nerve was within normal limits.  The bilateral second-digit Median sensory nerves were within normal limits The bilateral fifth-digit Ulnar sensory nerves were within normal limits The bilateral Radial sensory nerve were within normal limits The bilateral Axillary nerves were within normal limits  EMG Needle study was performed on selected left upper extremity muscles:   The left Pronator Teres showed markedly increased spontaneous activity. The left C7 paraspinal muscle showed moderately increased spontaneous activity. The left Deltoid, left Biceps, left Brachioradialis, left Triceps, left Extensor Digitorum Communis, left Extensor Carpi Ulnaris, left Flexor Carpi Radialis, left  Opponens Pollicis, left Abductor Pollicis Brevis, left First Dorsal interosseous were normal.  Patient could not tolerate further EMG needle testing. Kept asking for breaks due to pain. Patient complaining of predominantly left arm symptoms. Can follow up with EMG testing of the right arm if necessary.   Conclusion: EMG shows acute/ongoing  denervation in a left C6/C7 extremity muscle and a left-sided lower cervical paraspinal.  In conjunction with normal sensory conductions, this is consistent with a left C6/C7 radiculopathy.  No evidence for peripheral polyneuropathy or brachial plexopathy. Conduction block of the left radial nerve across Erb's point is of uncertain clinical significance given normal EMG needle exam of distal radial-innervated muscles and likely due to technical factors. Clinical correlation recommended.   Patient could not tolerate further EMG needle testing. Kept asking for breaks due to pain. Patient complaining of predominantly left arm symptoms. Can follow up with EMG needle testing of the right arm if necessary.    Naomie DeanAntonia Polk Minor, MD  [facility] [address] [place] [id number]  Phone [phone] Fax [phone]

## 2014-03-08 ENCOUNTER — Encounter: Payer: Self-pay | Admitting: Family Medicine

## 2014-03-13 ENCOUNTER — Ambulatory Visit (INDEPENDENT_AMBULATORY_CARE_PROVIDER_SITE_OTHER): Payer: BC Managed Care – PPO | Admitting: Sports Medicine

## 2014-03-13 ENCOUNTER — Encounter: Payer: Self-pay | Admitting: Sports Medicine

## 2014-03-13 DIAGNOSIS — M5412 Radiculopathy, cervical region: Secondary | ICD-10-CM | POA: Diagnosis not present

## 2014-03-13 NOTE — Progress Notes (Signed)
  Subjective:    CC: Follow-up  HPI: Left cervical radiculopathy: Confirmed a nerve conduction study is a left C6/C7 radiculopathy, MRI shows a C6-C7 disc protrusion that is very small. Unfortunately he has persistent pain with left arm numbness despite physical therapy, steroids, gabapentin. Moderate, persistent.  Past medical history, Surgical history, Family history not pertinant except as noted below, Social history, Allergies, and medications have been entered into the medical record, reviewed, and no changes needed.   Review of Systems: No fevers, chills, night sweats, weight loss, chest pain, or shortness of breath.   Objective:    General: Well Developed, well nourished, and in no acute distress.  Neuro: Alert and oriented x3, extra-ocular muscles intact, sensation grossly intact.  HEENT: Normocephalic, atraumatic, pupils equal round reactive to light, neck supple, no masses, no lymphadenopathy, thyroid nonpalpable.  Skin: Warm and dry, no rashes. Cardiac: Regular rate and rhythm, no murmurs rubs or gallops, no lower extremity edema.  Respiratory: Clear to auscultation bilaterally. Not using accessory muscles, speaking in full sentences. Neck: Negative spurling's Full neck range of motion Grip strength and sensation normal in bilateral hands Strength good C4 to T1 distribution No sensory change to C4 to T1 Reflexes normal  Impression and Recommendations:

## 2014-03-13 NOTE — Assessment & Plan Note (Signed)
Persistent left sided radiculopathy with a nerve conduction confirming left-sided C6 and C7 radiculopathy and a C6-C7 disc protrusion on MRI that does appear small. Considering persistent symptoms, nerve conduction results, and failure of response to gabapentin we are going to proceed with an epidural, left-sided C6-C7. Return to see me 3 weeks after her epidural, continue home rehabilitation exercises.

## 2014-03-13 NOTE — Addendum Note (Signed)
Addended by: Chalmers CaterUTTLE, Lacey Wallman H on: 03/13/2014 11:33 AM   Modules accepted: Orders, Medications

## 2014-03-21 ENCOUNTER — Other Ambulatory Visit: Payer: BC Managed Care – PPO

## 2014-03-29 ENCOUNTER — Other Ambulatory Visit: Payer: BC Managed Care – PPO

## 2014-03-29 ENCOUNTER — Ambulatory Visit
Admission: RE | Admit: 2014-03-29 | Discharge: 2014-03-29 | Disposition: A | Discharge status: Home / Self Care | Payer: BC Managed Care – PPO | Source: Ambulatory Visit | Attending: Sports Medicine | Admitting: Sports Medicine

## 2014-03-29 DIAGNOSIS — M5412 Radiculopathy, cervical region: Secondary | ICD-10-CM

## 2014-03-29 MED ORDER — TRIAMCINOLONE ACETONIDE 40 MG/ML IJ SUSP (RADIOLOGY)
60.0000 mg | Freq: Once | INTRAMUSCULAR | Status: AC
  Administered 2014-03-29: 60 mg via EPIDURAL

## 2014-03-29 MED ORDER — IOHEXOL 300 MG/ML  SOLN
1.0000 mL | Freq: Once | INTRAMUSCULAR | Status: AC | PRN
  Administered 2014-03-29: 1 mL via EPIDURAL

## 2014-03-29 NOTE — Discharge Instructions (Signed)

## 2014-03-29 NOTE — Progress Notes (Signed)
Pt in injection room, became pale and had a near syncopal episode. BP 121/ 66, 60 and was very pale. Helped pt to table and hung 250 cc of NS to run thru his hep locke.  Dr. Carlota RaspberryLamke in to check pt. 0902 am skin warm and dry and pt is now alert and oriented. Will proceed with injection.

## 2014-04-10 ENCOUNTER — Telehealth: Payer: Self-pay | Admitting: *Deleted

## 2014-04-10 ENCOUNTER — Encounter: Payer: Self-pay | Admitting: Sports Medicine

## 2014-04-10 ENCOUNTER — Ambulatory Visit (INDEPENDENT_AMBULATORY_CARE_PROVIDER_SITE_OTHER): Payer: BC Managed Care – PPO | Admitting: Sports Medicine

## 2014-04-10 DIAGNOSIS — M5412 Radiculopathy, cervical region: Secondary | ICD-10-CM | POA: Diagnosis not present

## 2014-04-10 MED ORDER — AMITRIPTYLINE HCL 50 MG PO TABS: ORAL_TABLET | ORAL | Status: DC

## 2014-04-10 NOTE — Assessment & Plan Note (Signed)
Brian DistanceRodney returns, he is 50% better after his cervical epidural. He continues to have a small amount of numbness over the left deltoid. To recap, we went through conservative measures including therapy, he did have a nerve conduction study and electromyography that did show a left-sided C6 and C7 radiculopathy. We then proceeded with gabapentin which was ineffective. Subsequently we obtained an epidural that provided 50% relief. At this point we are going to discontinue gabapentin and switch to amitriptyline, I would also like him to talk to neurosurgery, I do think he will not be a surgical candidate considering the small size of the disc protrusion, and that we will proceed with an additional epidural.

## 2014-04-10 NOTE — Progress Notes (Signed)
  Subjective:    CC: Follow-up after her epidural  HPI: Thereasa DistanceRodney has left-sided EMG and nerve conduction confirm C6 and C7 radiculopathy. He also has a very small and shallow C6-C7 disc fusion with desiccation on his MRI. He failed formal physical therapy, as well as gabapentin so we did proceed with a left-sided cervical epidural, he reports approximately 50% relief in his symptoms. He does not feel as though the gabapentin is helping all. Pain is predominantly in the left upper shoulder, and no longer radiates to the hand.  Past medical history, Surgical history, Family history not pertinant except as noted below, Social history, Allergies, and medications have been entered into the medical record, reviewed, and no changes needed.   Review of Systems: No fevers, chills, night sweats, weight loss, chest pain, or shortness of breath.   Objective:    General: Well Developed, well nourished, and in no acute distress.  Neuro: Alert and oriented x3, extra-ocular muscles intact, sensation grossly intact.  HEENT: Normocephalic, atraumatic, pupils equal round reactive to light, neck supple, no masses, no lymphadenopathy, thyroid nonpalpable.  Skin: Warm and dry, no rashes. Cardiac: Regular rate and rhythm, no murmurs rubs or gallops, no lower extremity edema.  Respiratory: Clear to auscultation bilaterally. Not using accessory muscles, speaking in full sentences.  Impression and Recommendations:

## 2014-04-10 NOTE — Telephone Encounter (Signed)
Tresa EndoKelly, patient's mother, called and asked if he was to continue using meloxicam as needed? She said that he was still using this medication even tho we didn't have it on his active list. Please advise.

## 2014-04-10 NOTE — Telephone Encounter (Signed)
Yes that's correct, use it as needed.

## 2014-04-11 NOTE — Telephone Encounter (Signed)
Mother notified and verbalized understanding.

## 2014-05-29 ENCOUNTER — Other Ambulatory Visit: Payer: Self-pay | Admitting: Family Medicine

## 2014-05-29 MED ORDER — CITALOPRAM HYDROBROMIDE 20 MG PO TABS: 20.0000 mg | ORAL_TABLET | Freq: Every day | ORAL | Status: DC

## 2014-09-01 ENCOUNTER — Other Ambulatory Visit: Payer: Self-pay | Admitting: *Deleted

## 2014-09-01 NOTE — Telephone Encounter (Signed)
Fax from [facility] requesting refill for Citalopram 20mg  take 1 tab daily. LOV 02/23/14, last written 05/29/14 w/ 0RF. Please advise. Thanks.

## 2014-09-04 ENCOUNTER — Other Ambulatory Visit: Payer: Self-pay | Admitting: *Deleted

## 2014-09-04 MED ORDER — CITALOPRAM HYDROBROMIDE 20 MG PO TABS: 20.0000 mg | ORAL_TABLET | Freq: Every day | ORAL | Status: DC

## 2014-09-04 NOTE — Telephone Encounter (Signed)
error 

## 2015-01-20 DIAGNOSIS — S6990XA Unspecified injury of unspecified wrist, hand and finger(s), initial encounter: Secondary | ICD-10-CM

## 2015-01-20 HISTORY — DX: Unspecified injury of unspecified wrist, hand and finger(s), initial encounter: S69.90XA

## 2015-01-21 ENCOUNTER — Encounter (HOSPITAL_BASED_OUTPATIENT_CLINIC_OR_DEPARTMENT_OTHER): Payer: Self-pay | Admitting: Emergency Medicine

## 2015-01-21 ENCOUNTER — Emergency Department (HOSPITAL_BASED_OUTPATIENT_CLINIC_OR_DEPARTMENT_OTHER)
Admission: EM | Admit: 2015-01-21 | Discharge: 2015-01-21 | Disposition: A | Discharge status: Home / Self Care | Payer: BLUE CROSS/BLUE SHIELD | Attending: Emergency Medicine | Admitting: Emergency Medicine

## 2015-01-21 ENCOUNTER — Ambulatory Visit (HOSPITAL_BASED_OUTPATIENT_CLINIC_OR_DEPARTMENT_OTHER): Payer: BLUE CROSS/BLUE SHIELD

## 2015-01-21 ENCOUNTER — Emergency Department (HOSPITAL_BASED_OUTPATIENT_CLINIC_OR_DEPARTMENT_OTHER): Payer: BLUE CROSS/BLUE SHIELD

## 2015-01-21 DIAGNOSIS — IMO0002 Reserved for concepts with insufficient information to code with codable children: Secondary | ICD-10-CM

## 2015-01-21 DIAGNOSIS — Z8719 Personal history of other diseases of the digestive system: Secondary | ICD-10-CM | POA: Diagnosis not present

## 2015-01-21 DIAGNOSIS — Y9289 Other specified places as the place of occurrence of the external cause: Secondary | ICD-10-CM | POA: Diagnosis not present

## 2015-01-21 DIAGNOSIS — Y9389 Activity, other specified: Secondary | ICD-10-CM | POA: Diagnosis not present

## 2015-01-21 DIAGNOSIS — Z23 Encounter for immunization: Secondary | ICD-10-CM | POA: Diagnosis not present

## 2015-01-21 DIAGNOSIS — S61213A Laceration without foreign body of left middle finger without damage to nail, initial encounter: Secondary | ICD-10-CM | POA: Diagnosis not present

## 2015-01-21 DIAGNOSIS — Y998 Other external cause status: Secondary | ICD-10-CM | POA: Diagnosis not present

## 2015-01-21 DIAGNOSIS — Z720 Tobacco use: Secondary | ICD-10-CM | POA: Diagnosis not present

## 2015-01-21 DIAGNOSIS — F419 Anxiety disorder, unspecified: Secondary | ICD-10-CM | POA: Diagnosis not present

## 2015-01-21 DIAGNOSIS — Z8739 Personal history of other diseases of the musculoskeletal system and connective tissue: Secondary | ICD-10-CM | POA: Insufficient documentation

## 2015-01-21 DIAGNOSIS — Y288XXA Contact with other sharp object, undetermined intent, initial encounter: Secondary | ICD-10-CM | POA: Diagnosis not present

## 2015-01-21 DIAGNOSIS — Z79899 Other long term (current) drug therapy: Secondary | ICD-10-CM | POA: Insufficient documentation

## 2015-01-21 DIAGNOSIS — S6992XA Unspecified injury of left wrist, hand and finger(s), initial encounter: Secondary | ICD-10-CM | POA: Diagnosis present

## 2015-01-21 DIAGNOSIS — Z87442 Personal history of urinary calculi: Secondary | ICD-10-CM | POA: Insufficient documentation

## 2015-01-21 DIAGNOSIS — Z872 Personal history of diseases of the skin and subcutaneous tissue: Secondary | ICD-10-CM | POA: Diagnosis not present

## 2015-01-21 MED ORDER — CEPHALEXIN 500 MG PO CAPS: 500.0000 mg | ORAL_CAPSULE | Freq: Four times a day (QID) | ORAL | Status: DC

## 2015-01-21 MED ORDER — HYDROMORPHONE HCL 1 MG/ML IJ SOLN
1.0000 mg | Freq: Once | INTRAMUSCULAR | Status: AC
  Administered 2015-01-21: 1 mg via INTRAMUSCULAR
  Filled 2015-01-21: qty 1

## 2015-01-21 MED ORDER — LIDOCAINE HCL (PF) 1 % IJ SOLN
5.0000 mL | Freq: Once | INTRAMUSCULAR | Status: AC
  Administered 2015-01-21: 5 mL
  Filled 2015-01-21: qty 5

## 2015-01-21 MED ORDER — IBUPROFEN 800 MG PO TABS: 800.0000 mg | ORAL_TABLET | Freq: Three times a day (TID) | ORAL | Status: DC

## 2015-01-21 MED ORDER — HYDROCODONE-ACETAMINOPHEN 5-325 MG PO TABS: 1.0000 | ORAL_TABLET | Freq: Four times a day (QID) | ORAL | Status: DC | PRN

## 2015-01-21 MED ORDER — TETANUS-DIPHTH-ACELL PERTUSSIS 5-2.5-18.5 LF-MCG/0.5 IM SUSP
0.5000 mL | Freq: Once | INTRAMUSCULAR | Status: AC
  Administered 2015-01-21: 0.5 mL via INTRAMUSCULAR
  Filled 2015-01-21: qty 0.5
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
## 2015-02-03 ENCOUNTER — Encounter: Payer: Self-pay | Admitting: Family Medicine

## 2015-04-07 ENCOUNTER — Encounter: Payer: Self-pay | Admitting: Family Medicine

## 2015-04-11 ENCOUNTER — Ambulatory Visit: Payer: BLUE CROSS/BLUE SHIELD | Admitting: Family Medicine

## 2015-04-11 ENCOUNTER — Ambulatory Visit (INDEPENDENT_AMBULATORY_CARE_PROVIDER_SITE_OTHER): Payer: BLUE CROSS/BLUE SHIELD | Admitting: Family Medicine

## 2015-04-11 ENCOUNTER — Encounter: Payer: Self-pay | Admitting: Family Medicine

## 2015-04-11 VITALS — BP 117/78 | HR 72 | Temp 98.6°F | Resp 20 | Wt 157.8 lb

## 2015-04-11 DIAGNOSIS — J0100 Acute maxillary sinusitis, unspecified: Secondary | ICD-10-CM | POA: Insufficient documentation

## 2015-04-11 MED ORDER — AMOXICILLIN-POT CLAVULANATE 875-125 MG PO TABS: 1.0000 | ORAL_TABLET | Freq: Two times a day (BID) | ORAL | Status: DC

## 2015-04-11 MED ORDER — FLUTICASONE PROPIONATE 50 MCG/ACT NA SUSP: 2.0000 | Freq: Every day | NASAL | Status: DC

## 2015-04-11 NOTE — Progress Notes (Signed)
   Subjective:    Patient ID: Brian Rubensteinodney Stapel Jr., male    DOB: 02-Oct-1990, 24 y.o.   MRN: [id number]  HPI   Cough: Pt states that about 1 month ago he experienced cough and sore throat. He has been taking mucinex and cough syrup initially but did not feel it was helpful. Patients states over the last week his symptoms are worsening. He now endorses rhinorrhea, nasal congestion, ear pressure, sinus pressure and PND. He is tolerating PO. He denies nausea, vomit or diarrhea. He has not had his flu shot this year.  Tobacco abuse: smoking cessation advised.   Current smoker  Past Medical History  Diagnosis Date  . Nephrolithiasis 09/2011    ED visit; left 3mm ureteral stone with slight hydroureter, right sided nonobstructing stone.  . Cholelithiasis     asymptomatic  . Acne vulgaris 11/16/2012    Adapalene -benz perox helped clear this up   . Generalized anxiety disorder 04/06/2013  . Tobacco dependence 05/09/2013  . Cervical radiculopathy at C6 02/2014    Left C6-C7 (NCS and [facility] for left arm paresthesias)  . Finger injury 01/2015    L long finger laceration; repaired in ED; followed up by [facility]--no apparent neurovascular injury.   No Known Allergies   Review of Systems Negative, with the exception of above mentioned in HPI     Objective:   Physical Exam BP 117/78 mmHg  Pulse 72  Temp(Src) 98.6 F (37 C)  Resp 20  Wt 157 lb 12.8 oz (71.578 kg)  SpO2 96% Gen: Afebrile. No acute distress. Nontoxic in appearance. Well developed, well nourished, caucasian male.  HENT: AT. [place]. Rt TM with air fluid levels, no erythema, no bulging. Left TM unable to be visualized 2/2 cerumen impaction. MMM, no oral lesions. Bilateral nares with erythema and swelling. Throat with erythema, no exudates. Mild cough and hoarseness on exam. TTP max sinus.  Eyes:Pupils Equal Round Reactive to light, Extraocular movements intact,  Conjunctiva without redness, discharge or  icterus. Neck/lymp/endocrine: Supple, bilateral cervical ant lymphadenopathy CV: RRR  Chest: CTAB, no wheeze or crackles Abd: Soft. NTND. BS present  Skin: No rashes, purpura or petechiae.     Assessment & Plan:  1. Acute maxillary sinusitis, recurrence not specified - Max sinusitis on exam.  -rest, hydrate. Mucinex.flonase. Abx.  - amoxicillin-clavulanate (AUGMENTIN) 875-125 MG tablet; Take 1 tablet by mouth 2 (two) times daily.  Dispense: 20 tablet; Refill: 0 - fluticasone (FLONASE) 50 MCG/ACT nasal spray; Place 2 sprays into both nostrils daily.  Dispense: 16 g; Refill: 6 - flu shot after xmas by nurse visit  F/U PRN

## 2015-04-11 NOTE — Patient Instructions (Signed)
I have called in augmentin and flonase for you. Take the flonase for at least a month.  Rest, hydrate. Take mucinex to dry secretions.   Sinusitis, Adult Sinusitis is redness, soreness, and inflammation of the paranasal sinuses. Paranasal sinuses are air pockets within the bones of your face. They are located beneath your eyes, in the middle of your forehead, and above your eyes. In healthy paranasal sinuses, mucus is able to drain out, and air is able to circulate through them by way of your nose. However, when your paranasal sinuses are inflamed, mucus and air can become trapped. This can allow bacteria and other germs to grow and cause infection. Sinusitis can develop quickly and last only a short time (acute) or continue over a long period (chronic). Sinusitis that lasts for more than 12 weeks is considered chronic. CAUSES Causes of sinusitis include:  Allergies.  Structural abnormalities, such as displacement of the cartilage that separates your nostrils (deviated septum), which can decrease the air flow through your nose and sinuses and affect sinus drainage.  Functional abnormalities, such as when the small hairs (cilia) that line your sinuses and help remove mucus do not work properly or are not present. SIGNS AND SYMPTOMS Symptoms of acute and chronic sinusitis are the same. The primary symptoms are pain and pressure around the affected sinuses. Other symptoms include:  Upper toothache.  Earache.  Headache.  Bad breath.  Decreased sense of smell and taste.  A cough, which worsens when you are lying flat.  Fatigue.  Fever.  Thick drainage from your nose, which often is green and may contain pus (purulent).  Swelling and warmth over the affected sinuses. DIAGNOSIS Your health care provider will perform a physical exam. During your exam, your health care provider may perform any of the following to help determine if you have acute sinusitis or chronic sinusitis:  Look in  your nose for signs of abnormal growths in your nostrils (nasal polyps).  Tap over the affected sinus to check for signs of infection.  View the inside of your sinuses using an imaging device that has a light attached (endoscope). If your health care provider suspects that you have chronic sinusitis, one or more of the following tests may be recommended:  Allergy tests.  Nasal culture. A sample of mucus is taken from your nose, sent to a lab, and screened for bacteria.  Nasal cytology. A sample of mucus is taken from your nose and examined by your health care provider to determine if your sinusitis is related to an allergy. TREATMENT Most cases of acute sinusitis are related to a viral infection and will resolve on their own within 10 days. Sometimes, medicines are prescribed to help relieve symptoms of both acute and chronic sinusitis. These may include pain medicines, decongestants, nasal steroid sprays, or saline sprays. However, for sinusitis related to a bacterial infection, your health care provider will prescribe antibiotic medicines. These are medicines that will help kill the bacteria causing the infection. Rarely, sinusitis is caused by a fungal infection. In these cases, your health care provider will prescribe antifungal medicine. For some cases of chronic sinusitis, surgery is needed. Generally, these are cases in which sinusitis recurs more than 3 times per year, despite other treatments. HOME CARE INSTRUCTIONS  Drink plenty of water. Water helps thin the mucus so your sinuses can drain more easily.  Use a humidifier.  Inhale steam 3-4 times a day (for example, sit in the bathroom with the shower running).  Apply a warm, moist washcloth to your face 3-4 times a day, or as directed by your health care provider.  Use saline nasal sprays to help moisten and clean your sinuses.  Take medicines only as directed by your health care provider.  If you were prescribed either an  antibiotic or antifungal medicine, finish it all even if you start to feel better. SEEK IMMEDIATE MEDICAL CARE IF:  You have increasing pain or severe headaches.  You have nausea, vomiting, or drowsiness.  You have swelling around your face.  You have vision problems.  You have a stiff neck.  You have difficulty breathing.   This information is not intended to replace advice given to you by your health care provider. Make sure you discuss any questions you have with your health care provider.   Document Released: 04/07/2005 Document Revised: 04/28/2014 Document Reviewed: 04/22/2011 Elsevier Interactive Patient Education [employer].

## 2015-05-07 ENCOUNTER — Ambulatory Visit (INDEPENDENT_AMBULATORY_CARE_PROVIDER_SITE_OTHER): Payer: BLUE CROSS/BLUE SHIELD | Admitting: Family Medicine

## 2015-05-07 ENCOUNTER — Telehealth: Payer: Self-pay | Admitting: *Deleted

## 2015-05-07 ENCOUNTER — Encounter: Payer: Self-pay | Admitting: Family Medicine

## 2015-05-07 VITALS — BP 109/79 | HR 95 | Temp 98.9°F | Resp 20 | Wt 149.0 lb

## 2015-05-07 DIAGNOSIS — J039 Acute tonsillitis, unspecified: Secondary | ICD-10-CM | POA: Diagnosis not present

## 2015-05-07 DIAGNOSIS — J029 Acute pharyngitis, unspecified: Secondary | ICD-10-CM

## 2015-05-07 LAB — CBC WITH DIFFERENTIAL/PLATELET
BASOS ABS: 0.1 10*3/uL (ref 0.0–0.1)
BASOS PCT: 0.3 % (ref 0.0–3.0)
EOS ABS: 0.1 10*3/uL (ref 0.0–0.7)
Eosinophils Relative: 0.4 % (ref 0.0–5.0)
HEMATOCRIT: 47.1 % (ref 39.0–52.0)
HEMOGLOBIN: 15.6 g/dL (ref 13.0–17.0)
Lymphs Abs: 0.7 10*3/uL (ref 0.7–4.0)
MCHC: 33.1 g/dL (ref 30.0–36.0)
MCV: 90.3 fl (ref 78.0–100.0)
MONOS PCT: 9 % (ref 3.0–12.0)
Monocytes Absolute: 1.5 10*3/uL — ABNORMAL HIGH (ref 0.1–1.0)
NEUTROS ABS: 14.2 10*3/uL — AB (ref 1.4–7.7)
Neutrophils Relative %: 86.1 % — ABNORMAL HIGH (ref 43.0–77.0)
Platelets: 183 10*3/uL (ref 150.0–400.0)
RBC: 5.22 Mil/uL (ref 4.22–5.81)
RDW: 12.2 % (ref 11.5–15.5)
WBC: 16.5 10*3/uL — AB (ref 4.0–10.5)

## 2015-05-07 LAB — MONONUCLEOSIS SCREEN: Mono Screen: NEGATIVE

## 2015-05-07 MED ORDER — PREDNISONE 20 MG PO TABS: 20.0000 mg | ORAL_TABLET | Freq: Every day | ORAL | Status: DC

## 2015-05-07 MED ORDER — METHYLPREDNISOLONE ACETATE 80 MG/ML IJ SUSP
80.0000 mg | Freq: Once | INTRAMUSCULAR | Status: AC
  Administered 2015-05-07: 80 mg via INTRAMUSCULAR

## 2015-05-07 MED ORDER — HYDROCODONE-ACETAMINOPHEN 5-325 MG PO TABS: 1.0000 | ORAL_TABLET | Freq: Four times a day (QID) | ORAL | Status: DC | PRN

## 2015-05-07 MED ORDER — AMOXICILLIN-POT CLAVULANATE 875-125 MG PO TABS: 1.0000 | ORAL_TABLET | Freq: Two times a day (BID) | ORAL | Status: DC

## 2015-05-07 NOTE — Progress Notes (Signed)
Patient ID: Brian Mills., male   DOB: July 11, 1990, 25 y.o.   MRN: [id number]   Subjective:    Patient ID: Brian Mills., male    DOB: [birth year], 25 y.o.   MRN: [id number]  HPI   Sore throat: Patient presents today with increasingly swollen sore throat. He was seen approximately one month ago and treated with Augmentin for a sinus infection/sore throat. Patient admittedly states he only took half the prescription, and once he started to feel better he stopped taking the medicine. He states then his symptoms returned a few weeks later and he restarted the second half of the medication. He again became mildly improved on partial treatment secondary to taking leftover antibiotics. Patient was seen in urgent care setting on January 15, where he had a rapid strep was negative. Patient was given a Z-Pak, which he reports compliance with for 2 days. The evening after being at the urgent care, this past Saturday, he started spiking a temperature MAXIMUM TEMPERATURE 100.6 Fahrenheit, and experienced nausea. Patient's mother states he hasn't eaten anything in 3 days, with the exception of a chocolate pudding. He denies vomiting, diarrhea, abdominal pain or chills. Patient has not been around any sick contacts. Patient states now his throat is so sore he has difficulty talking. He reports the left side of his throat is worse than the right side. He is able to see large white patches in the back of his throat. He is increasingly more fatigued. He states he is in a great deal pain, and Advil/Aleve is not helpful at all.  Tobacco abuse: smoking cessation advised.   Current smoker  Past Medical History  Diagnosis Date  . Nephrolithiasis 09/2011    ED visit; left 3mm ureteral stone with slight hydroureter, right sided nonobstructing stone.  . Cholelithiasis     asymptomatic  . Acne vulgaris 11/16/2012    Adapalene -benz perox helped clear this up   . Generalized anxiety disorder 04/06/2013  . Tobacco  dependence 05/09/2013  . Cervical radiculopathy at C6 02/2014    Left C6-C7 (NCS and EMG at [facility] for left arm paresthesias)  . Finger injury 01/2015    L long finger laceration; repaired in ED; followed up by [facility]--no apparent neurovascular injury.   No Known Allergies   Review of Systems Negative, with the exception of above mentioned in HPI     Objective:   Physical Exam BP 109/79 mmHg  Pulse 95  Temp(Src) 98.9 F (37.2 C)  Resp 20  Wt 149 lb (67.586 kg)  SpO2 97% Gen: Afebrile. Difficulty swallowing. Difficulty talking secondary to throat pain. Pale, fatigued. Well developed, well nourished, caucasian male.  HENT: AT. [place]. Bilateral TM no erythema, no bulging. Tacky mucous membranes, erythema, swelling bilateral tonsils, thick white/green exudative material left greater than right tonsil. Bilateral nares with mild erythema and swelling.   Eyes:Pupils Equal Round Reactive to light, Extraocular movements intact,  Conjunctiva without redness, discharge or icterus. Neck/lymp/endocrine: Supple,  left large anterior cervical lymph node , multiple shotty lymph nodes bilateral cervical anterior. CV: RRR  Chest: CTAB, no wheeze or crackles Abd: Soft. NTND. BS present  Skin: No rashes, purpura or petechiae.     Assessment & Plan:  1. Sore throat - Culture, Group A Strep--> Sent   2. Acute tonsillitis, unspecified etiology - Ambulatory referral to ENT urgently, concern for abscess formation secondary to patient not completing antibiotic therapy. - Augmentin and prednisone prescribed, Depo-Medrol 80 mg injection in office today.  Prednisone taper to start tomorrow. - Short-term course of Vicodin for pain - Mononucleosis screening. Along with CBC with differential today. - amoxicillin-clavulanate (AUGMENTIN) 875-125 MG tablet; Take 1 tablet by mouth 2 (two) times daily.  Dispense: 20 tablet; Refill: 0 - predniSONE (DELTASONE) 20 MG tablet; Take 1 tablet (20 mg total) by  mouth daily with breakfast.  Dispense: 18 tablet; Refill: 0 - HYDROcodone-acetaminophen (NORCO/VICODIN) 5-325 MG tablet; Take 1 tablet by mouth every 6 (six) hours as needed for moderate pain.  Dispense: 10 tablet; Refill: 0 - methylPREDNISolone acetate (DEPO-MEDROL) injection 80 mg; Inject 1 mL (80 mg total) into the muscle once., - Patient advised if symptoms worsen at all, throat becomes more swollen he is to be seen in the emergency room immediately. - Patient will need to be seen by either myself or ENT within 2 days.

## 2015-05-07 NOTE — Telephone Encounter (Signed)
Message left by patient mother she states they went to ENT and were told they thought it is MONO . Mother states she would like a call ASAP when results of Mono screen comes back.

## 2015-05-07 NOTE — Patient Instructions (Signed)
I ma concerned over a abscess formation. I have prescribed Augmentin and a steroid.  You will need to follow with either ENT or myself within 2 days.  If worsening symptoms or throat feels tighter, you must be seen in the ED immediately. I have prescribed a few pain pills to hep with the pain.   Peritonsillar Cellulitis Peritonsillar cellulitis is an infection around a tonsil that results in a severe sore throat. If the condition is not treated, pus can collect in the throat. CAUSES Peritonsillar cellulitis is usually caused by a combination of several strains of bacteria. RISK FACTORS This condition is more likely to develop in:  People who have frequent tonsil infections.  People who take antibiotic medicines frequently.  People who smoke. SYMPTOMS Early symptoms of this condition include:  A fever.  Chills.  Soreness on one side of the throat.  Pain in one ear.  Pain when swallowing.  Tiredness. Later symptoms include:  Severe pain when swallowing.  Drooling.  Trouble opening the mouth wide.  Bad breath.  Changes in the voice. DIAGNOSIS This condition may be diagnosed based on symptoms, a physical exam, and a test in which a sample of fluid from the throat is tested (throat culture). You may also have a blood test. TREATMENT This condition is usually treated with antibiotic medicines. You may need to take these medicines by mouth or through an IV tube. Additional treatment may include:  Medicines for pain, fever, or swelling. Some medicines may be given through an IV tube.  A procedure to drain a collection of pus (abscess).  Surgery to remove the tonsils (tonsillectomy). This may be done if you get this condition often. HOME CARE INSTRUCTIONS Eating and Drinking  If it is hard to swallow, try switching to a liquid or soft-food diet until you get better.  Drink enough fluid to keep your urine clear or pale yellow. Medicines  Take your antibiotic medicine  as told by your health care provider. Do not stop taking the antibiotic even if you start to feel better.  Take over-the-counter and prescription medicines only as told by your health care provider. Activities  Rest and get plenty of sleep.  Return to work or school as directed by your health care provider. General Instructions  Do not smoke.  Keep all follow-up visits as told by your health care provider. This is important.  To help ease pain and swelling, gargle with a salt-water mixture 3-4 times per day or as needed. To make a salt-water mixture, completely dissolve -1 tsp of salt in 1 cup of warm water. SEEK MEDICAL CARE IF:  Your swelling gets worse.  You have difficulty swallowing.  You are unable to take your antibiotic.  You have a fever that does not improve after you take medicine.  Your voice changes. SEEK IMMEDIATE MEDICAL CARE IF:  You have trouble breathing.  Your pain gets worse.  You see pus around or near your tonsils.  You cough up bloody spit.  You are unable to swallow.  You are drooling.   This information is not intended to replace advice given to you by your health care provider. Make sure you discuss any questions you have with your health care provider.   Document Released: 07/02/2009 Document Revised: 08/22/2014 Document Reviewed: 04/03/2014 Elsevier Interactive Patient Education [employer]2016 [employer]vier Inc.

## 2015-05-08 NOTE — Telephone Encounter (Signed)
Done

## 2015-05-08 NOTE — Telephone Encounter (Signed)
Discussed lab results with mother today. Patient has a elevated white count at 16.5 (prior to steroid) with a predominant left shift neutrophils 86.1%, neutrophil count #14.2. Lymphocytes normal, monocyte percent normal, monocyte count # 1.5.  Mononucleosis screening negative. Throat culture pending. - Mother was encouraged to make follow-up appointment with either myself or ENT before the weekend. - Patient is mildly improving this morning with steroid and Augmentin use. Mother advised if patient's condition worsened to be seen immediately.    Suzanne:Please make sure these results get faxed to the ENT he saw yesterday. Thanks.

## 2015-05-09 ENCOUNTER — Telehealth: Payer: Self-pay | Admitting: Family Medicine

## 2015-05-09 LAB — CULTURE, GROUP A STREP: ORGANISM ID, BACTERIA: NORMAL

## 2015-05-09 NOTE — Telephone Encounter (Signed)
Please call pt: - His throat culture did not show positive for strep. Please sure he is improving and has adequate follow up with either myself or ENT.

## 2015-05-10 NOTE — Telephone Encounter (Signed)
Spoke with patient mother she states he is improving still has white patches and redness in throat but swelling has gone down. Patient has follow up appt here tomorrow.

## 2015-05-11 ENCOUNTER — Encounter: Payer: Self-pay | Admitting: Family Medicine

## 2015-05-11 ENCOUNTER — Ambulatory Visit (INDEPENDENT_AMBULATORY_CARE_PROVIDER_SITE_OTHER): Payer: BLUE CROSS/BLUE SHIELD | Admitting: Family Medicine

## 2015-05-11 VITALS — BP 131/80 | HR 71 | Temp 98.4°F | Resp 20 | Wt 153.8 lb

## 2015-05-11 DIAGNOSIS — J029 Acute pharyngitis, unspecified: Secondary | ICD-10-CM

## 2015-05-11 NOTE — Patient Instructions (Signed)
I am glad you are much improved! Finish all medications, follow up only if needed.

## 2015-05-11 NOTE — Progress Notes (Signed)
Patient ID: Brian Rubenstein., male   DOB: July 08, 1990, 25 y.o.   MRN: [id number] Patient ID: Brian Daigler., male   DOB: 29-Aug-1990, 25 y.o.   MRN: [id number]   Subjective:    Patient ID: Brian Rubenstein., male    DOB: [birth year], 25 y.o.   MRN: [id number]  HPI  CC: follow up to exudative pharyngitis  Exudative pharyngitis: Pt presents for follow-up on exudative pharyngitis.He was evaluated by ENT and they felt it was Mononucleosis related. Patients mono screen and strep culture was negative in our office. His WBC count was 16.5 with a left shift. Patient has continued his  Augmentin and steroids. He reports one low grade temperature the day after being seen, then he has remained afebrile. He is able to swallow without difficulty now, but appetite has remained decreased. He is feeling much improved and pain has resolved.   Tobacco abuse: pt quit smoking 3 weeks ago.     Past Medical History  Diagnosis Date  . Nephrolithiasis 09/2011    ED visit; left 3mm ureteral stone with slight hydroureter, right sided nonobstructing stone.  . Cholelithiasis     asymptomatic  . Acne vulgaris 11/16/2012    Adapalene -benz perox helped clear this up   . Generalized anxiety disorder 04/06/2013  . Tobacco dependence 05/09/2013  . Cervical radiculopathy at C6 02/2014    Left C6-C7 (NCS and EMG at [facility] for left arm paresthesias)  . Finger injury 01/2015    L long finger laceration; repaired in ED; followed up by [facility]--no apparent neurovascular injury.   No Known Allergies  Review of Systems Negative, with the exception of above mentioned in HPI     Objective:   Physical Exam BP 131/80 mmHg  Pulse 71  Temp(Src) 98.4 F (36.9 C)  Resp 20  Wt 153 lb 12.8 oz (69.763 kg)  SpO2 94% Gen: Afebrile. NAD, nontoxic in appearance. Good color, energetic. Well developed, well nourished, caucasian male.  HENT: AT. [place]. Bilateral TM no erythema, no bulging. MMM. No oral lesions. Bilateral  nares with mild erythema , no swelling. Throat without exudate, very mild erythema. No hoarseness or cough on exam.  Eyes:Pupils Equal Round Reactive to light, Extraocular movements intact,  Conjunctiva without redness, discharge or icterus. Neck/lymp/endocrine: Supple, No LAD.  CV: RRR  Chest: CTAB, no wheeze or crackles Abd: Soft. NTND. BS present  Skin: No rashes, purpura or petechiae.     Assessment & Plan:  Acute tonsillitis, unspecified etiology - Almost completely resolved.  - Continue abx/steroid until therapy completed. - Maintain hydration and nutrition.  - Quit smoking. Great job.  - F/U if needed

## 2015-06-22 ENCOUNTER — Encounter: Payer: Self-pay | Admitting: Sports Medicine

## 2015-06-22 ENCOUNTER — Ambulatory Visit (INDEPENDENT_AMBULATORY_CARE_PROVIDER_SITE_OTHER): Payer: BLUE CROSS/BLUE SHIELD

## 2015-06-22 ENCOUNTER — Ambulatory Visit (INDEPENDENT_AMBULATORY_CARE_PROVIDER_SITE_OTHER): Payer: BLUE CROSS/BLUE SHIELD | Admitting: Sports Medicine

## 2015-06-22 VITALS — BP 143/85 | HR 81 | Wt 157.0 lb

## 2015-06-22 DIAGNOSIS — M25511 Pain in right shoulder: Secondary | ICD-10-CM

## 2015-06-22 DIAGNOSIS — M958 Other specified acquired deformities of musculoskeletal system: Secondary | ICD-10-CM | POA: Diagnosis not present

## 2015-06-22 DIAGNOSIS — M549 Dorsalgia, unspecified: Secondary | ICD-10-CM

## 2015-06-22 MED ORDER — MELOXICAM 15 MG PO TABS: ORAL_TABLET | ORAL | Status: DC

## 2015-06-22 NOTE — Progress Notes (Signed)
   Subjective:    I'm seeing this patient as a consultation for:  Dr. Nicoletta BaPhilip Mills  CC: right shoulder pain  HPI: This is a pleasant 25 year old male, for the past couple of months he's had pain that he localizes in the posterior aspect of his right shoulder, and an abnormal protuberance of his right scapula particularly with flexion of the shoulder. Denies any paresthesias into the upper extremity, no neck pain, no pain with overhead activities, he did increase his workout regimen recently. No constitutional symptoms, no trauma.  Past medical history, Surgical history, Family history not pertinant except as noted below, Social history, Allergies, and medications have been entered into the medical record, reviewed, and no changes needed.   Review of Systems: No headache, visual changes, nausea, vomiting, diarrhea, constipation, dizziness, abdominal pain, skin rash, fevers, chills, night sweats, weight loss, swollen lymph nodes, body aches, joint swelling, muscle aches, chest pain, shortness of breath, mood changes, visual or auditory hallucinations.   Objective:   General: Well Developed, well nourished, and in no acute distress.  Neuro/Psych: Alert and oriented x3, extra-ocular muscles intact, able to move all 4 extremities, sensation grossly intact. Skin: Warm and dry, no rashes noted.  Respiratory: Not using accessory muscles, speaking in full sentences, trachea midline.  Cardiovascular: Pulses palpable, no extremity edema. Abdomen: Does not appear distended. Right Shoulder: Inspection reveals no abnormalities, atrophy or asymmetry. Right-sided winged scapula, no tenderness over the scapulothoracic bursa. Palpation is normal with no tenderness over AC joint or bicipital groove. ROM is full in all planes. Rotator cuff strength normal throughout. No signs of impingement with negative Neer and Hawkin's tests, empty can. Speeds and Yergason's tests normal. No labral pathology noted with  negative Obrien's, negative crank, negative clunk, and good stability. Normal scapular function observed. No painful arc and no drop arm sign. No apprehension sign Neck: Negative spurling's Full neck range of motion Grip strength and sensation normal in bilateral hands Strength good C4 to T1 distribution No sensory change to C4 to T1 Reflexes normal  Impression and Recommendations:   This case required medical decision making of moderate complexity.

## 2015-06-22 NOTE — Assessment & Plan Note (Signed)
Question right long thoracic nerve injury. At this point we are get x-rays, add meloxicam, physical therapy for scapular protraction exercises. And we do need a nerve conduction study of the right upper extremity as well as long thoracic nerve.

## 2015-07-20 ENCOUNTER — Telehealth: Payer: Self-pay

## 2015-07-20 NOTE — Telephone Encounter (Signed)
Patient declined flu vaccine.

## 2015-08-02 ENCOUNTER — Ambulatory Visit (INDEPENDENT_AMBULATORY_CARE_PROVIDER_SITE_OTHER): Payer: Self-pay | Admitting: Neurology

## 2015-08-02 ENCOUNTER — Ambulatory Visit (INDEPENDENT_AMBULATORY_CARE_PROVIDER_SITE_OTHER): Payer: BLUE CROSS/BLUE SHIELD | Admitting: Neurology

## 2015-08-02 ENCOUNTER — Encounter: Payer: Self-pay | Admitting: Neurology

## 2015-08-02 DIAGNOSIS — M958 Other specified acquired deformities of musculoskeletal system: Secondary | ICD-10-CM

## 2015-08-02 DIAGNOSIS — M792 Neuralgia and neuritis, unspecified: Secondary | ICD-10-CM | POA: Diagnosis not present

## 2015-08-02 NOTE — Progress Notes (Signed)
Please refer to EMG and nerve conduction study procedure note. 

## 2015-08-02 NOTE — Procedures (Signed)
     HISTORY:  Brian Mills is a 25 year old gentleman with a 2 or 3 month history of mild scapular winging on the right. The patient reports a painless onset of the deficit. He denies any significant difficulty with elevation of his right arm. He is being evaluated for possible right long thoracic neuropathy.  NERVE CONDUCTION STUDIES:  Nerve conduction studies were performed on the right upper extremity. The distal motor latencies and motor amplitudes for the median and ulnar nerves were within normal limits. The F wave latencies and nerve conduction velocities for these nerves were also normal. The sensory latencies for the median and ulnar nerves were normal.   EMG STUDIES:  EMG study was performed on the right upper extremity:  The first dorsal interosseous muscle reveals 2 to 4 K units with full recruitment. No fibrillations or positive waves were noted. The abductor pollicis brevis muscle reveals 2 to 4 K units with full recruitment. No fibrillations or positive waves were noted. The extensor indicis proprius muscle reveals 1 to 3 K units with full recruitment. No fibrillations or positive waves were noted. The pronator teres muscle reveals 2 to 3 K units with full recruitment. No fibrillations or positive waves were noted. The biceps muscle reveals 1 to 2 K units with full recruitment. No fibrillations or positive waves were noted. The triceps muscle reveals 2 to 4 K units with full recruitment. No fibrillations or positive waves were noted. The anterior deltoid muscle reveals 2 to 3 K units with full recruitment. No fibrillations or positive waves were noted. The lower trapezius muscle was tested to levels, no acute denervation was seen at either level tested. The serratus anterior muscle was tested and revealed 0.5 to 1K motor units, full recruitment seen. No fibrillations or positive waves were seen. The cervical paraspinal muscles were tested at 2 levels. No abnormalities of  insertional activity were seen at either level tested. There was good relaxation.   IMPRESSION:  Nerve conduction studies done on the right upper extremity were within normal limits. No evidence of a neuropathy is seen. EMG evaluation of the right upper extremity was unremarkable. Evaluation of the serratus anterior muscle on the right shows no acute denervation. The scapular winging is mild, it is possible that the patient may have a healed neuropathy. No denervation was seen in the lower trapezius muscle. There is no evidence of an overlying cervical radiculopathy.  Marlan Palau. Keith Jilleen Essner MD 08/02/2015 11:01 AM  [facility] [address] [place] [id number]  Phone [phone] Fax [phone]

## 2016-09-19 DIAGNOSIS — M5136 Other intervertebral disc degeneration, lumbar region with discogenic back pain only: Secondary | ICD-10-CM

## 2016-09-19 HISTORY — DX: Other intervertebral disc degeneration, lumbar region with discogenic back pain only: M51.360

## 2016-09-19 HISTORY — DX: Other intervertebral disc degeneration, lumbar region: M51.36

## 2016-10-09 ENCOUNTER — Telehealth: Payer: Self-pay

## 2016-10-09 ENCOUNTER — Ambulatory Visit (INDEPENDENT_AMBULATORY_CARE_PROVIDER_SITE_OTHER): Payer: BLUE CROSS/BLUE SHIELD | Admitting: Sports Medicine

## 2016-10-09 ENCOUNTER — Encounter: Payer: Self-pay | Admitting: Sports Medicine

## 2016-10-09 ENCOUNTER — Ambulatory Visit (INDEPENDENT_AMBULATORY_CARE_PROVIDER_SITE_OTHER): Payer: BLUE CROSS/BLUE SHIELD

## 2016-10-09 DIAGNOSIS — M545 Low back pain: Secondary | ICD-10-CM

## 2016-10-09 DIAGNOSIS — M5136 Other intervertebral disc degeneration, lumbar region with discogenic back pain only: Secondary | ICD-10-CM | POA: Insufficient documentation

## 2016-10-09 MED ORDER — MELOXICAM 15 MG PO TABS: ORAL_TABLET | ORAL | 3 refills | Status: DC

## 2016-10-09 MED ORDER — PREDNISONE 50 MG PO TABS: ORAL_TABLET | ORAL | 0 refills | Status: DC

## 2016-10-09 NOTE — Progress Notes (Signed)
   Subjective:    I'm seeing this patient as a consultation for:  Dr. Nicoletta BaPhilip McGowen  CC: Low back pain  HPI: This is a pleasant 26 year old male with known cervical degenerative disc disease that responded well to a cervical epidural, for the past several months she's had axial low back pain, worse with sitting, flexion, Valsalva. No bowel or bladder dysfunction, saddle numbness, no constitutional symptoms.  Past medical history:  Negative.  See flowsheet/record as well for more information.  Surgical history: Negative.  See flowsheet/record as well for more information.  Family history: Negative.  See flowsheet/record as well for more information.  Social history: Negative.  See flowsheet/record as well for more information.  Allergies, and medications have been entered into the medical record, reviewed, and no changes needed.   Review of Systems: No headache, visual changes, nausea, vomiting, diarrhea, constipation, dizziness, abdominal pain, skin rash, fevers, chills, night sweats, weight loss, swollen lymph nodes, body aches, joint swelling, muscle aches, chest pain, shortness of breath, mood changes, visual or auditory hallucinations.   Objective:   General: Well Developed, well nourished, and in no acute distress.  Neuro/Psych: Alert and oriented x3, extra-ocular muscles intact, able to move all 4 extremities, sensation grossly intact. Skin: Warm and dry, no rashes noted.  Respiratory: Not using accessory muscles, speaking in full sentences, trachea midline.  Cardiovascular: Pulses palpable, no extremity edema. Abdomen: Does not appear distended. Back Exam:  Inspection: Unremarkable  Motion: Flexion 45 deg, Extension 45 deg, Side Bending to 45 deg bilaterally,  Rotation to 45 deg bilaterally  SLR laying: Negative  XSLR laying: Negative  Palpable tenderness: None. FABER: negative. Sensory change: Gross sensation intact to all lumbar and sacral dermatomes.  Reflexes: 2+ at both  patellar tendons, 2+ at achilles tendons, Babinski's downgoing.  Strength at foot  Plantar-flexion: 5/5 Dorsi-flexion: 5/5 Eversion: 5/5 Inversion: 5/5  Leg strength  Quad: 5/5 Hamstring: 5/5 Hip flexor: 5/5 Hip abductors: 5/5  Gait unremarkable.  Lumbar spine x-rays personally reviewed and are unremarkable.  Impression and Recommendations:   This case required medical decision making of moderate complexity.  Discogenic low back pain Prednisone, meloxicam, physical therapy, x-rays Return in one month, MRI for interventional planning if no better.

## 2016-10-09 NOTE — Progress Notes (Signed)
Letter written

## 2016-10-09 NOTE — Telephone Encounter (Signed)
Pt states he is in need of a letter for light duty at work. Please assist.

## 2016-10-09 NOTE — Assessment & Plan Note (Signed)
Prednisone, meloxicam, physical therapy, x-rays Return in one month, MRI for interventional planning if no better.

## 2016-10-27 ENCOUNTER — Ambulatory Visit: Payer: BLUE CROSS/BLUE SHIELD | Admitting: Physical Therapy

## 2016-11-06 ENCOUNTER — Ambulatory Visit (INDEPENDENT_AMBULATORY_CARE_PROVIDER_SITE_OTHER): Payer: BLUE CROSS/BLUE SHIELD | Admitting: Sports Medicine

## 2016-11-06 ENCOUNTER — Encounter: Payer: Self-pay | Admitting: Sports Medicine

## 2016-11-06 DIAGNOSIS — M5136 Other intervertebral disc degeneration, lumbar region with discogenic back pain only: Secondary | ICD-10-CM

## 2016-11-06 NOTE — Assessment & Plan Note (Signed)
Persistent pain despite meloxicam, home rehabilitation exercises, x-rays were normal. He did have cervical degenerative disc disease and responded well to a cervical epidural. He had difficulty getting into formal physical therapy due to timing with work, I'm going to change his PT referral to a location with weekend hours, and we are going to proceed with MRI, he has failed greater than 6 weeks of physician directed conservative measures. He would also like to return for custom orthotics, we can check leg lengths at that time.

## 2016-11-06 NOTE — Progress Notes (Signed)
  Subjective:    CC: follow-up  HPI: Low back pain: Axial and discogenic, no bowel or bladder dysfunction, saddle numbness, constitutional symptoms, he was unable to do formal physical therapy, did some home exercises and took his prednisone meloxicam with minimal results. Pain is persistent. He is now failed greater than 6 weeks of physician directed rehabilitation exercises. X-rays are unremarkable.  Past medical history:  Negative.  See flowsheet/record as well for more information.  Surgical history: Negative.  See flowsheet/record as well for more information.  Family history: Negative.  See flowsheet/record as well for more information.  Social history: Negative.  See flowsheet/record as well for more information.  Allergies, and medications have been entered into the medical record, reviewed, and no changes needed.   Review of Systems: No fevers, chills, night sweats, weight loss, chest pain, or shortness of breath.   Objective:    General: Well Developed, well nourished, and in no acute distress.  Neuro: Alert and oriented x3, extra-ocular muscles intact, sensation grossly intact.  HEENT: Normocephalic, atraumatic, pupils equal round reactive to light, neck supple, no masses, no lymphadenopathy, thyroid nonpalpable.  Skin: Warm and dry, no rashes. Cardiac: Regular rate and rhythm, no murmurs rubs or gallops, no lower extremity edema.  Respiratory: Clear to auscultation bilaterally. Not using accessory muscles, speaking in full sentences. Back Exam:  Inspection: Unremarkable  Motion: Flexion 45 deg, Extension 45 deg, Side Bending to 45 deg bilaterally,  Rotation to 45 deg bilaterally  SLR laying: Negative  XSLR laying: Negative  Palpable tenderness: None. FABER: negative. Sensory change: Gross sensation intact to all lumbar and sacral dermatomes.  Reflexes: 2+ at both patellar tendons, 2+ at achilles tendons, Babinski's downgoing.  Strength at foot  Plantar-flexion: 5/5  Dorsi-flexion: 5/5 Eversion: 5/5 Inversion: 5/5  Leg strength  Quad: 5/5 Hamstring: 5/5 Hip flexor: 5/5 Hip abductors: 5/5  Gait unremarkable.  Impression and Recommendations:    Discogenic low back pain Persistent pain despite meloxicam, home rehabilitation exercises, x-rays were normal. He did have cervical degenerative disc disease and responded well to a cervical epidural. He had difficulty getting into formal physical therapy due to timing with work, I'm going to change his PT referral to a location with weekend hours, and we are going to proceed with MRI, he has failed greater than 6 weeks of physician directed conservative measures. He would also like to return for custom orthotics, we can check leg lengths at that time.  I spent 25 minutes with this patient, greater than 50% was face-to-face time counseling regarding the above diagnoses

## 2016-11-12 ENCOUNTER — Ambulatory Visit (INDEPENDENT_AMBULATORY_CARE_PROVIDER_SITE_OTHER): Payer: BLUE CROSS/BLUE SHIELD | Admitting: Sports Medicine

## 2016-11-12 ENCOUNTER — Encounter: Payer: Self-pay | Admitting: Sports Medicine

## 2016-11-12 DIAGNOSIS — Q667 Congenital pes cavus, unspecified foot: Secondary | ICD-10-CM | POA: Insufficient documentation

## 2016-11-12 NOTE — Progress Notes (Signed)

## 2016-11-12 NOTE — Assessment & Plan Note (Signed)
Custom orthotics as above, leg lengths are equal.

## 2016-11-13 ENCOUNTER — Encounter: Payer: Self-pay | Admitting: Family Medicine

## 2016-11-17 ENCOUNTER — Ambulatory Visit (INDEPENDENT_AMBULATORY_CARE_PROVIDER_SITE_OTHER): Payer: BLUE CROSS/BLUE SHIELD

## 2016-11-17 ENCOUNTER — Encounter: Payer: Self-pay | Admitting: Family Medicine

## 2016-11-17 DIAGNOSIS — M5136 Other intervertebral disc degeneration, lumbar region: Secondary | ICD-10-CM

## 2016-11-17 DIAGNOSIS — M5117 Intervertebral disc disorders with radiculopathy, lumbosacral region: Secondary | ICD-10-CM | POA: Diagnosis not present

## 2016-11-17 DIAGNOSIS — M545 Low back pain: Secondary | ICD-10-CM | POA: Diagnosis not present

## 2016-11-24 ENCOUNTER — Ambulatory Visit (INDEPENDENT_AMBULATORY_CARE_PROVIDER_SITE_OTHER): Payer: BLUE CROSS/BLUE SHIELD | Admitting: Sports Medicine

## 2016-11-24 ENCOUNTER — Encounter: Payer: Self-pay | Admitting: Sports Medicine

## 2016-11-24 DIAGNOSIS — M5136 Other intervertebral disc degeneration, lumbar region with discogenic back pain only: Secondary | ICD-10-CM

## 2016-11-24 NOTE — Assessment & Plan Note (Signed)
MRI confirms L5-S1 degenerative disc disease with mild desiccation. Pain is axial and predominantly on the right, at this point we have failed greater than 6 weeks of conservative measures we are going to proceed with a right L5-S1 interlaminar epidural. Return to see me one month after the injection to evaluate relief.

## 2016-11-24 NOTE — Progress Notes (Signed)
  Subjective:    CC: Follow-up MRI  HPI: Brian Mills returns, he's a pleasant 26 year old male, he failed conservative treatment for his back so we obtained an MRI, the results of which will be dictated below.  Past medical history:  Negative.  See flowsheet/record as well for more information.  Surgical history: Negative.  See flowsheet/record as well for more information.  Family history: Negative.  See flowsheet/record as well for more information.  Social history: Negative.  See flowsheet/record as well for more information.  Allergies, and medications have been entered into the medical record, reviewed, and no changes needed.   Review of Systems: No fevers, chills, night sweats, weight loss, chest pain, or shortness of breath.   Objective:    General: Well Developed, well nourished, and in no acute distress.  Neuro: Alert and oriented x3, extra-ocular muscles intact, sensation grossly intact.  HEENT: Normocephalic, atraumatic, pupils equal round reactive to light, neck supple, no masses, no lymphadenopathy, thyroid nonpalpable.  Skin: Warm and dry, no rashes. Cardiac: Regular rate and rhythm, no murmurs rubs or gallops, no lower extremity edema.  Respiratory: Clear to auscultation bilaterally. Not using accessory muscles, speaking in full sentences.  MRI shows an L5-S1 protruding disc with a broad-based but mostly central component, there really isn't any foraminal stenosis.  Impression and Recommendations:    Discogenic low back pain MRI confirms L5-S1 degenerative disc disease with mild desiccation. Pain is axial and predominantly on the right, at this point we have failed greater than 6 weeks of conservative measures we are going to proceed with a right L5-S1 interlaminar epidural. Return to see me one month after the injection to evaluate relief.  I spent 25 minutes with this patient, greater than 50% was face-to-face time counseling regarding the above diagnoses

## 2016-12-03 ENCOUNTER — Encounter: Payer: Self-pay | Admitting: Family Medicine

## 2016-12-19 ENCOUNTER — Ambulatory Visit
Admission: RE | Admit: 2016-12-19 | Discharge: 2016-12-19 | Disposition: A | Discharge status: Home / Self Care | Payer: BLUE CROSS/BLUE SHIELD | Source: Ambulatory Visit | Attending: Sports Medicine | Admitting: Sports Medicine

## 2016-12-19 VITALS — BP 150/82 | HR 62

## 2016-12-19 DIAGNOSIS — M5136 Other intervertebral disc degeneration, lumbar region: Secondary | ICD-10-CM

## 2016-12-19 DIAGNOSIS — M5127 Other intervertebral disc displacement, lumbosacral region: Secondary | ICD-10-CM | POA: Diagnosis not present

## 2016-12-19 MED ORDER — METHYLPREDNISOLONE ACETATE 40 MG/ML INJ SUSP (RADIOLOG
120.0000 mg | Freq: Once | INTRAMUSCULAR | Status: AC
  Administered 2016-12-19: 120 mg via EPIDURAL

## 2016-12-19 MED ORDER — IOPAMIDOL (ISOVUE-M 200) INJECTION 41%
1.0000 mL | Freq: Once | INTRAMUSCULAR | Status: AC
  Administered 2016-12-19: 1 mL via EPIDURAL

## 2016-12-19 NOTE — Discharge Instructions (Signed)

## 2017-03-04 ENCOUNTER — Ambulatory Visit: Payer: BLUE CROSS/BLUE SHIELD | Admitting: Family Medicine

## 2017-03-04 ENCOUNTER — Encounter: Payer: Self-pay | Admitting: Family Medicine

## 2017-03-04 ENCOUNTER — Other Ambulatory Visit: Payer: Self-pay

## 2017-03-04 VITALS — BP 102/66 | HR 60 | Temp 98.3°F | Resp 16 | Ht 71.0 in | Wt 164.2 lb

## 2017-03-04 DIAGNOSIS — Z23 Encounter for immunization: Secondary | ICD-10-CM

## 2017-03-04 DIAGNOSIS — J069 Acute upper respiratory infection, unspecified: Secondary | ICD-10-CM

## 2017-03-04 DIAGNOSIS — J209 Acute bronchitis, unspecified: Secondary | ICD-10-CM

## 2017-03-04 MED ORDER — PREDNISONE 20 MG PO TABS: ORAL_TABLET | ORAL | 0 refills | Status: DC

## 2017-03-04 NOTE — Addendum Note (Signed)
Addended by: Eulah PontALBRIGHT, LISA M on: 03/04/2017 01:29 PM   Modules accepted: Orders

## 2017-03-04 NOTE — Progress Notes (Signed)
OFFICE VISIT  03/04/2017   CC:  Chief Complaint  Patient presents with  . Cough    HPI:    Patient is a 26 y.o. Caucasian male who presents for respiratory symptoms. Onset about 10d ago with nasal congestion, HA, ST and some cough.   The nose is still congested and cough has worsened.  Feels chest tightness, SOB, wheezing.  No CP or hemoptysis. Says he has some green mucous. No fever.  Tried mucinex. Still smoking about 1 pack per week cigs. Says his GF was just recently dx'd with a "viral infection" similar to his.  Past Medical History:  Diagnosis Date  . Acne vulgaris 11/16/2012   Adapalene -benz perox helped clear this up   . Cervical radiculopathy at C6 02/2014   Left C6-C7 ([facility]hes[facility])  . Cholelithiasis    asymptomatic  . 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  MRI 11/17/16--L5/S1 DDD with herniation indenting thecal sac and bilat S1 nerve sheaths--no nerve compression.  [facility] med.  . Finger injury 01/2015   L long finger laceration; repaired in ED; followed up by [facility]--no apparent neurovascular injury.  . Generalized anxiety disorder 04/06/2013  . Nephrolithiasis 09/2011   ED visit; left 3mm ureteral stone with slight hydroureter, right sided nonobstructing stone.  . Tobacco dependence 05/09/2013    History reviewed. No pertinent surgical history.  Outpatient Medications Prior to Visit  Medication Sig Dispense Refill  . meloxicam (MOBIC) 15 MG tablet One tab PO qAM with breakfast for 2 weeks, then daily prn pain. (Patient not taking: Reported on 03/04/2017) 30 tablet 3   No facility-administered medications prior to visit.     No Known Allergies  ROS As per HPI  PE: Blood pressure 102/66, pulse 60, temperature 98.3 F (36.8 C), temperature source Oral, resp. rate 16, height 5\' 11"  (1.803 m), weight 164 lb 4 oz (74.5 kg), SpO2 98 %. VS: noted--normal. Gen:  alert, NAD, NONTOXIC APPEARING. HEENT: eyes without injection, drainage, or swelling.  Ears: EACs clear, TMs with normal light reflex and landmarks.  Nose: Clear rhinorrhea, with some dried, crusty exudate adherent to mildly injected mucosa.  No purulent d/c.  No paranasal sinus TTP.  No facial swelling.  Throat and mouth without focal lesion.  No pharyngial swelling, erythema, or exudate.   Neck: supple, no LAD.   LUNGS: CTA bilat, nonlabored resps.  Forced expiration does not induce wheezing or coughing. CV: RRR, no m/r/g. EXT: no c/c/e SKIN: no rash  LABS:  none  IMPRESSION AND PLAN:  Viral URI with acute viral bronchitis. No sign of RAD on exam today. Encouraged complete smoking cessation. Prednisone 40mg  qd x 5d. Get otc generic robitussin DM OR Mucinex DM and use as directed on the packaging for cough and congestion. Use otc generic saline nasal spray 2-3 times per day to irrigate/moisturize your nasal passages.  Flu vaccine given today.  An After Visit Summary was printed and given to the patient.  FOLLOW UP: Return if symptoms worsen or fail to improve.  Signed:  Santiago BumpersPhil McGowen, MD           03/04/2017

## 2017-03-04 NOTE — Patient Instructions (Signed)
Get otc generic robitussin DM OR Mucinex DM and use as directed on the packaging for cough and congestion. Use otc generic saline nasal spray 2-3 times per day to irrigate/moisturize your nasal passages.   

## 2017-04-17 ENCOUNTER — Encounter: Payer: Self-pay | Admitting: Family Medicine

## 2017-04-17 ENCOUNTER — Ambulatory Visit: Payer: BLUE CROSS/BLUE SHIELD | Admitting: Family Medicine

## 2017-04-17 VITALS — BP 122/77 | HR 54 | Ht 71.0 in | Wt 170.0 lb

## 2017-04-17 DIAGNOSIS — M5416 Radiculopathy, lumbar region: Secondary | ICD-10-CM | POA: Diagnosis not present

## 2017-04-17 NOTE — Progress Notes (Signed)
Brian Rubensteinodney Konczal Jr. is a 26 y.o. male who presents to [facility] today for back pain.  Brian Mills has a history of low back pain with right S1 radicular symptoms.  He had a intralaminar right L5-S1 epidural steroid injection in August 2018.  This provided good pain relief for 2-3 months.  He notes the pain has returned a bit.  He notes pain in his back with minimal radiating pain to his right leg.  He denies any significant weakness or numbness.  He denies any difficulty walking.  He would like a repeat epidural steroid injection if possible.   Past Medical History:  Diagnosis Date  . Acne vulgaris 11/16/2012   Adapalene -benz perox helped clear this up   . Cervical radiculopathy at C6 02/2014   Left C6-C7 ([facility]aresthes[facility])  . Cholelithiasis    asymptomatic  . 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  MRI 11/17/16--L5/S1 DDD with herniation indenting thecal sac and bilat S1 nerve sheaths--no nerve compression.  [facility].  . Finger injury 01/2015   L long finger laceration; repaired in ED; followed up by [facility]--no apparent neurovascular injury.  . Generalized anxiety disorder 04/06/2013  . Nephrolithiasis 09/2011   ED visit; left 3mm ureteral stone with slight hydroureter, right sided nonobstructing stone.  . Tobacco dependence 05/09/2013   No past surgical history on file. Social History   Tobacco Use  . Smoking status: Former Smoker    Types: Cigarettes    Last attempt to quit: 04/15/2015    Years since quitting: 2.0  . Smokeless tobacco: Current User    Types: Chew  Substance Use Topics  . Alcohol use: Yes    Alcohol/week: 0.0 oz    Comment: Occasionally     ROS:  As above   Medications: No current outpatient medications on file.   No current facility-administered medications for this visit.    No Known Allergies   Exam:  BP  122/77   Pulse (!) 54   Ht 5\' 11"  (1.803 m)   Wt 170 lb (77.1 kg)   BMI 23.71 kg/m  General: Well Developed, well nourished, and in no acute distress.  Neuro/Psych: Alert and oriented x3, extra-ocular muscles intact, able to move all 4 extremities, sensation grossly intact. Skin: Warm and dry, no rashes noted.  Respiratory: Not using accessory muscles, speaking in full sentences, trachea midline.  Cardiovascular: Pulses palpable, no extremity edema. Abdomen: Does not appear distended. MSK: Lumbar spine normal motion and gait.  Lower extremity strength is intact     CLINICAL DATA:  Low back pain with right leg pain over the last 2 months.  EXAM: MRI LUMBAR SPINE WITHOUT CONTRAST  TECHNIQUE: Multiplanar, multisequence MR imaging of the lumbar spine was performed. No intravenous contrast was administered.  COMPARISON:  Radiography 10/09/2016  FINDINGS: Segmentation:  5 lumbar type vertebral bodies.  Alignment:  Mild curvature convex to the right.  Vertebrae:  No fracture or primary bone lesion.  Conus medullaris: Extends to the L1-2 level and appears normal.  Paraspinal and other soft tissues: Small gallstones dependent in the gallbladder. Otherwise negative.  Disc levels:  No abnormality at L4-5 or above.  L5-S1: Disc degeneration with a central disc herniation that contacts the ventral thecal sac and both S1 root sleeves as they bud from the thecal sac. Clear neural compression is not demonstrated,  but neural irritation could occur.  IMPRESSION: Single level pathology at L5-S1. Disc degeneration with a central disc herniation that contacts the thecal sac and both S1 root sleeves. Clear neural compression is not demonstrated, but neural irritation could occur.   Electronically Signed   By: Paulina FusiMark  Shogry M.D.   On: 11/17/2016 08:36     Assessment and Plan: 26 y.o. male with lumbago with right S1 radicular symptoms.  Patient had great results  with previous epidural steroid injection and notes that his pain is somewhat consistent to how it was before the injection.  He would like to retry the epidural steroid injection if possible.  I think this is reasonable however if he does not improve I think next steps would be either facet injections or referral to physical therapy.  Regardless we also discussed core strengthening exercises including Pilates or personal trainer.  Recheck as needed.    Orders Placed This Encounter  Procedures  . DG Epidurography    Order Specific Question:   Reason for Exam (SYMPTOM  OR DIAGNOSIS REQUIRED)    Answer:   interlaminar approach right at L5-S1    Order Specific Question:   Preferred imaging location?    Answer:   [id number]   No orders of the defined types were placed in this encounter.   Discussed warning signs or symptoms. Please see discharge instructions. Patient expresses understanding.  I spent 25 minutes with this patient, greater than 50% was face-to-face time counseling regarding ddx and treatment plan.

## 2017-04-17 NOTE — Patient Instructions (Signed)
Thank you for coming in today. [facility] about the [facility].  Let me know if you do not hear anything.  Consider adding piliates core exercises for back pain.  PT can be very helpful if needed.   Recheck or call if not better.

## 2017-04-19 ENCOUNTER — Encounter: Payer: Self-pay | Admitting: Family Medicine

## 2017-04-28 ENCOUNTER — Ambulatory Visit: Payer: BLUE CROSS/BLUE SHIELD | Admitting: Family Medicine

## 2017-04-28 ENCOUNTER — Encounter: Payer: Self-pay | Admitting: Family Medicine

## 2017-04-28 VITALS — BP 115/67 | HR 82 | Temp 98.2°F | Resp 16 | Ht 71.0 in | Wt 165.2 lb

## 2017-04-28 DIAGNOSIS — B349 Viral infection, unspecified: Secondary | ICD-10-CM | POA: Diagnosis not present

## 2017-04-28 DIAGNOSIS — R509 Fever, unspecified: Secondary | ICD-10-CM | POA: Diagnosis not present

## 2017-04-28 DIAGNOSIS — R52 Pain, unspecified: Secondary | ICD-10-CM | POA: Diagnosis not present

## 2017-04-28 DIAGNOSIS — R61 Generalized hyperhidrosis: Secondary | ICD-10-CM | POA: Diagnosis not present

## 2017-04-28 DIAGNOSIS — R5383 Other fatigue: Secondary | ICD-10-CM | POA: Diagnosis not present

## 2017-04-28 DIAGNOSIS — R110 Nausea: Secondary | ICD-10-CM | POA: Diagnosis not present

## 2017-04-28 LAB — C-REACTIVE PROTEIN: CRP: 4.5 mg/dL (ref 0.5–20.0)

## 2017-04-28 LAB — COMPREHENSIVE METABOLIC PANEL
ALBUMIN: 4.3 g/dL (ref 3.5–5.2)
ALK PHOS: 72 U/L (ref 39–117)
ALT: 84 U/L — ABNORMAL HIGH (ref 0–53)
AST: 58 U/L — AB (ref 0–37)
BUN: 13 mg/dL (ref 6–23)
CHLORIDE: 104 meq/L (ref 96–112)
CO2: 31 mEq/L (ref 19–32)
CREATININE: 1.29 mg/dL (ref 0.40–1.50)
Calcium: 9.3 mg/dL (ref 8.4–10.5)
GFR: 72.23 mL/min (ref >60.00)
Glucose, Bld: 95 mg/dL (ref 70–99)
Potassium: 4.8 mEq/L (ref 3.5–5.1)
SODIUM: 140 meq/L (ref 135–145)
TOTAL PROTEIN: 6.6 g/dL (ref 6.0–8.3)
Total Bilirubin: 1 mg/dL (ref 0.2–1.2)

## 2017-04-28 LAB — TSH: TSH: 0.98 u[IU]/mL (ref 0.35–4.50)

## 2017-04-28 MED ORDER — PROMETHAZINE HCL 12.5 MG PO TABS: ORAL_TABLET | ORAL | 0 refills | Status: DC

## 2017-04-28 MED ORDER — PANTOPRAZOLE SODIUM 40 MG PO TBEC: 40.0000 mg | DELAYED_RELEASE_TABLET | Freq: Every day | ORAL | 1 refills | Status: DC

## 2017-04-28 NOTE — Progress Notes (Signed)
OFFICE VISIT  04/28/2017   CC:  Chief Complaint  Patient presents with  . Fever    99.87F  . Nausea   HPI:    Patient is a 27 y.o. Caucasian male who presents for "low grade fever and nausea". Onset about a week ago, symptoms waxing and waning. Sweats, cold/chills, nausea without vomiting.  Cough on/off for the last week, dry.  Tm 99.8. No ST, HA, nasal cong, or runny nose.  No rash.  No diarrhea. No abd pain.  When nausea is bad, his whole body aches. Eating makes nausea worse.  Appetite is down.  Eating a lot of soup. No recent sick contacts. Tyl cold/flu use lately on/off.  Past Medical History:  Diagnosis Date  . Acne vulgaris 11/16/2012   Adapalene -benz perox helped clear this up   . Cervical radiculopathy at C6 02/2014   Left C6-C7 (NCS and EMG at [facility] for left arm paresthesias)  . Cholelithiasis    asymptomatic  . 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  MRI 11/17/16--L5/S1 DDD with herniation indenting thecal sac and bilat S1 nerve sheaths--no nerve compression.  ESI fall 2018 helpful for shortterm--plan is to repeat this and possibly get facet jt injections in future.  . Finger injury 01/2015   L long finger laceration; repaired in ED; followed up by [facility]--no apparent neurovascular injury.  . Generalized anxiety disorder 04/06/2013  . Nephrolithiasis 09/2011   ED visit; left 55m ureteral stone with slight hydroureter, right sided nonobstructing stone.  . Tobacco dependence 05/09/2013    History reviewed. No pertinent surgical history.  No outpatient medications prior to visit.   No facility-administered medications prior to visit.     No Known Allergies  ROS As per HPI  PE: Blood pressure 115/67, pulse 82, temperature 98.2 F (36.8 C), temperature source Oral, resp. rate 16, height _0  (1.803 m), weight 165 lb 4 oz (75 kg), SpO2 97 %. Gen: Alert, well appearing.  Patient is oriented to person, place, time, and  situation. AFFECT: pleasant, lucid thought and speech. ENT: Ears: EACs clear, normal epithelium.  TMs with good light reflex and landmarks bilaterally.  Eyes: no injection, icteris, swelling, or exudate.  EOMI, PERRLA. Nose: no drainage or turbinate edema/swelling.  No injection or focal lesion.  Mouth: lips without lesion/swelling.  Oral mucosa pink and moist.  Dentition intact and without obvious caries or gingival swelling.  Oropharynx without erythema, exudate, or swelling.  Neck - No masses or thyromegaly or limitation in range of motion CV: RRR, no m/r/g.   LUNGS: CTA bilat, nonlabored resps, good aeration in all lung fields. ABD: soft, NT, ND, BS normal.  No hepatospenomegaly or mass.  No bruits. EXT: no clubbing, cyanosis, or edema.    LABS:    Chemistry   No results found for: NA, K, CL, CO2, BUN, CREATININE, GLU No results found for: CALCIUM, ALKPHOS, AST, ALT, BILITOT    IMPRESSION AND PLAN:  Viral syndrome suspected. ? Gastritis.  Parvo?  Low suspicion of biliary etiology since NO ABD PAIN (but he does have hx of asymptomatic gallstones, so if symptoms persist we may have to look into this as a cause HIDA scan and/or gen surg referral). Start promethazine 12.5, 1-2 tabs q6h prn AND pantoprazole 481mqd. Check CBC, CMET, TSH, H pylori Ab, ESR, CRP, monospot, EBV titers.  An After Visit Summary was printed and given to the patient.  FOLLOW UP: Return in about 1 week (around  05/05/2017) for f/u acute illness.  Signed:  Crissie Sickles, MD           04/28/2017  ADDENDUM 11: 50 AM, 04/28/17:  Pt passed out with blood draw and not all the lab tests will be able to be run. He recovered conciousness withing seconds and was feeling back to normal with cool towel to forehead and some water within 5 minutes.  He walked out of office w/out problem, denied any lightheaded feeling or weakness.  Signed:  Crissie Sickles, MD           04/28/2017

## 2017-04-29 LAB — EPSTEIN-BARR VIRUS VCA ANTIBODY PANEL
EBV NA IGG: 297 U/mL — AB
EBV VCA IgG: 206 U/mL — ABNORMAL HIGH

## 2017-04-30 ENCOUNTER — Telehealth: Payer: Self-pay | Admitting: *Deleted

## 2017-04-30 NOTE — Telephone Encounter (Signed)
Copied from CRM [id number]. Topic: Quick Communication - Lab Results >> Apr 30, 2017  8:41 AM Percival SpanishKennedy, Cheryl W wrote:  Call back about lab results    [phone] or [phone] pt said he don't understand mychart

## 2017-04-30 NOTE — Telephone Encounter (Signed)
See result note I sent just now.-thx

## 2017-04-30 NOTE — Telephone Encounter (Signed)
SW pts mother, okay per DPR. I advised her that Dr. Milinda CaveMcGowen has not sent me a note about pts labs. I advised her that I would let Dr. Milinda CaveMcGowen know they called. She voiced understanding.

## 2017-05-01 ENCOUNTER — Ambulatory Visit
Admission: RE | Admit: 2017-05-01 | Discharge: 2017-05-01 | Disposition: A | Discharge status: Home / Self Care | Payer: BLUE CROSS/BLUE SHIELD | Source: Ambulatory Visit | Attending: Family Medicine | Admitting: Family Medicine

## 2017-05-01 DIAGNOSIS — M47817 Spondylosis without myelopathy or radiculopathy, lumbosacral region: Secondary | ICD-10-CM | POA: Diagnosis not present

## 2017-05-01 LAB — HEPATITIS B SURFACE ANTIGEN: HEP B S AG: NONREACTIVE

## 2017-05-01 LAB — EPSTEIN-BARR VIRUS EARLY D ANTIGEN ANTIBODY, IGG: EBV EA IgG: <9 U/mL

## 2017-05-01 LAB — TEST AUTHORIZATION

## 2017-05-01 LAB — HEPATITIS C ANTIBODY
Hepatitis C Ab: NONREACTIVE
SIGNAL TO CUT-OFF: 0.06 (ref <1.00)

## 2017-05-01 LAB — EPSTEIN-BARR VIRUS NUCLEAR ANTIGEN ANTIBODY, IGG: EBV NA IGG: 304 U/mL — AB

## 2017-05-01 MED ORDER — IOPAMIDOL (ISOVUE-M 200) INJECTION 41%
1.0000 mL | Freq: Once | INTRAMUSCULAR | Status: AC
  Administered 2017-05-01: 1 mL via EPIDURAL

## 2017-05-01 MED ORDER — METHYLPREDNISOLONE ACETATE 40 MG/ML INJ SUSP (RADIOLOG
120.0000 mg | Freq: Once | INTRAMUSCULAR | Status: AC
  Administered 2017-05-01: 120 mg via EPIDURAL

## 2017-05-01 NOTE — Discharge Instructions (Signed)

## 2017-05-15 ENCOUNTER — Ambulatory Visit: Payer: BLUE CROSS/BLUE SHIELD | Admitting: Sports Medicine

## 2017-06-02 ENCOUNTER — Ambulatory Visit (INDEPENDENT_AMBULATORY_CARE_PROVIDER_SITE_OTHER): Payer: BLUE CROSS/BLUE SHIELD

## 2017-06-02 ENCOUNTER — Ambulatory Visit: Payer: BLUE CROSS/BLUE SHIELD | Admitting: Family Medicine

## 2017-06-02 ENCOUNTER — Encounter: Payer: Self-pay | Admitting: Family Medicine

## 2017-06-02 ENCOUNTER — Other Ambulatory Visit: Payer: Self-pay

## 2017-06-02 VITALS — BP 122/84 | HR 84 | Temp 99.0°F | Ht 71.0 in | Wt 161.2 lb

## 2017-06-02 DIAGNOSIS — R05 Cough: Secondary | ICD-10-CM

## 2017-06-02 DIAGNOSIS — R059 Cough, unspecified: Secondary | ICD-10-CM

## 2017-06-02 MED ORDER — MONTELUKAST SODIUM 10 MG PO TABS: 10.0000 mg | ORAL_TABLET | Freq: Every day | ORAL | 3 refills | Status: DC

## 2017-06-02 MED ORDER — PREDNISONE 10 MG PO TABS
ORAL_TABLET | ORAL | 0 refills | Status: DC
Start: 2017-06-02 — End: 2017-07-03

## 2017-06-02 NOTE — Patient Instructions (Addendum)
Please follow up if symptoms do not improve or as needed.   Please go to our [facility] [place] office to get your xrays done. You can walk in M-F between 8am and 5pm. Tell them you are there for xrays ordered by me. They will send me the results, then I will let you know the results with instructions.   Address: [address], [place], [place] [phone]  (office sits at [address] at [address]; from here, turn left onto [address] take to [address], turn right and go for a mile or so, office will be on left across form [place] [place] )    Cough, Adult A cough helps to clear your throat and lungs. A cough may last only 2-3 weeks (acute), or it may last longer than 8 weeks (chronic). Many different things can cause a cough. A cough may be a sign of an illness or another medical condition. Follow these instructions at home:  Pay attention to any changes in your cough.  Take medicines only as told by your doctor. ? If you were prescribed an antibiotic medicine, take it as told by your doctor. Do not stop taking it even if you start to feel better. ? Talk with your doctor before you try using a cough medicine.  Drink enough fluid to keep your pee (urine) clear or pale yellow.  If the air is dry, use a cold steam vaporizer or humidifier in your home.  Stay away from things that make you cough at work or at home.  If your cough is worse at night, try using extra pillows to raise your head up higher while you sleep.  Do not smoke, and try not to be around smoke. If you need help quitting, ask your doctor.  Do not have caffeine.  Do not drink alcohol.  Rest as needed. Contact a doctor if:  You have new problems (symptoms).  You cough up yellow fluid (pus).  Your cough does not get better after 2-3 weeks, or your cough gets worse.  Medicine does not help your cough and you are not sleeping well.  You have pain  that gets worse or pain that is not helped with medicine.  You have a fever.  You are losing weight and you do not know why.  You have night sweats. Get help right away if:  You cough up blood.  You have trouble breathing.  Your heartbeat is very fast. This information is not intended to replace advice given to you by your health care provider. Make sure you discuss any questions you have with your health care provider. Document Released: 12/19/2010 Document Revised: 09/13/2015 Document Reviewed: 06/14/2014 Elsevier Interactive Patient Education  [employer]2018 Elsevier Inc.

## 2017-06-02 NOTE — Progress Notes (Signed)
Subjective  CC:  Chief Complaint  Patient presents with  . Cough    x 2-3 weeks     HPI: Brian Mills. is a 27 y.o. male who presents to the office today to address the problems listed above in the chief complaint. Same day appt; pcp not available.  28 yo male with 4-5 week h/o cough. Started with viral syndrome - cough, fever, nausea ... Those sxs resolved but cough persists. Has allergies, h/o asthma as child. Taking flonase, zyrtec x 2 weeks and singulair (mom's) x 4 days. Cough is dry and hacking and worse when he lies down and awakens in the am. No sob. Chest is sore from coughing. No sinus pain, f/c/s. Former smoker now on Software [employer].   I reviewed the patients updated PMH, FH, and SocHx.    Patient Active Problem List   Diagnosis Date Noted  . Pes cavus 11/12/2016  . Discogenic low back pain 10/09/2016  . 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 Acute tonsillitis 05/07/2015  . Maxillary sinusitis, acute 04/11/2015  . Cervical radiculitis 11/28/2013  . Tobacco dependence 05/09/2013  . Generalized anxiety disorder 04/06/2013   Current Meds  Medication Sig  . pantoprazole (PROTONIX) 40 MG tablet Take 1 tablet (40 mg total) by mouth daily.    Allergies: Patient has No Known Allergies. Family History: Patient family history includes Arthritis in his maternal grandfather; Hypertension in his maternal grandfather. Social History:  Patient  reports that he quit smoking about 2 years ago. His smoking use included cigarettes. His smokeless tobacco use includes chew. He reports that he drinks alcohol. He reports that he does not use drugs.  Review of Systems: Constitutional: Negative for fever malaise or anorexia Cardiovascular: negative for chest pain Respiratory: negative for SOB or productive cough Gastrointestinal: negative for abdominal pain  Objective  Vitals: BP 122/84   Pulse 84   Temp 99 F (37.2 C)   Ht 5\' 11"  (1.803 m)   Wt 161 lb 3.2 oz (73.1 kg)    SpO2 96%   BMI 22.48 kg/m  General: no acute distress , A&Ox3, appears comfortable, clears throat multiple time, harsh forced cough HEENT: PEERL, conjunctiva normal, Oropharynx moist - large thick PND present, nasal mucosa is inflamed, no sinus ttp,neck is supple Cardiovascular:  RRR without murmur or gallop.  Respiratory:  Good breath sounds bilaterally, CTAB with normal respiratory effort Skin:  Warm, no rashes  Assessment  1. Cough      Plan   Post inflammatory and allergy related:  Add prednisone. Continue zyrtec, singulair and mucinex dm. Check cxr. F/u with pcp in 2 weeks if not improved.   Follow up: 2 weeks with pcp if not improved.     Commons side effects, risks, benefits, and alternatives for medications and treatment plan prescribed today were discussed, and the patient expressed understanding of the given instructions. Patient is instructed to call or message via MyChart if he/she has any questions or concerns regarding our treatment plan. No barriers to understanding were identified. We discussed Red Flag symptoms and signs in detail. Patient expressed understanding regarding what to do in case of urgent or emergency type symptoms.   Medication list was reconciled, printed and provided to the patient in AVS. Patient instructions and summary information was reviewed with the patient as documented in the AVS. This note was prepared with assistance of Dragon voice recognition software. Occasional wrong-word or sound-a-like substitutions may have occurred due to the inherent limitations of voice  recognition software  Orders Placed This Encounter  Procedures  . DG Chest 2 View   Meds ordered this encounter  Medications  . predniSONE (DELTASONE) 10 MG tablet    Sig: Take 4 tabs qd x 2 days, 3 qd x 2 days, 2 qd x 2d, 1qd x 3 days    Dispense:  21 tablet    Refill:  0  . montelukast (SINGULAIR) 10 MG tablet    Sig: Take 1 tablet (10 mg total) by mouth at bedtime.     Dispense:  30 tablet    Refill:  3

## 2017-06-25 ENCOUNTER — Other Ambulatory Visit: Payer: Self-pay | Admitting: Family Medicine

## 2017-06-25 NOTE — Telephone Encounter (Signed)
LOV: 04/28/17 pt was to return in 1 week for f/u. Will send Rx for #30 w/ 0Rf. Needs office visit for more refills.   Left message for pt to call back.   Okay for PEC to advise pt and schedule apt.

## 2017-06-29 NOTE — Telephone Encounter (Signed)
SW pts mother okay per DPR, pt is no longer taking this medication, will update med list.

## 2017-07-02 ENCOUNTER — Telehealth: Payer: Self-pay | Admitting: Family Medicine

## 2017-07-02 MED ORDER — BENZONATATE 200 MG PO CAPS: 200.0000 mg | ORAL_CAPSULE | Freq: Three times a day (TID) | ORAL | 0 refills | Status: DC | PRN

## 2017-07-02 NOTE — Telephone Encounter (Signed)
LMOM for pt to CB.  Patient has not been seen for sickness since 2.12.19, if he is still coughing he needs to be evaluated.  Please have patient schedule appointment when he returns call.

## 2017-07-02 NOTE — Telephone Encounter (Signed)
Spoke to Dr Milinda CaveMcGowen and he approved patient to have tessalon 200mg  # 20 no RF.   Patient's mom advised, okay per DPR.

## 2017-07-02 NOTE — Telephone Encounter (Signed)
Patient mother calling and stating appt is scheduled for Monday, Can something be called in to last him till them? Mother, Tresa EndoKelly Call back [phone]

## 2017-07-02 NOTE — Telephone Encounter (Signed)
Copied from CRM [id number]. Topic: Quick Communication - See Telephone Encounter >> Jul 02, 2017 10:20 AM Eston Mouldavis, Josalynn Johndrow B wrote: CRM for notification. See Telephone encounter for:   Pts mother is requesting tessalon perles be sent to:  [id number] - [place], [place] - [phone] (Phone) [phone] (Fax)    07/02/17.

## 2017-07-02 NOTE — Telephone Encounter (Signed)
Please advise 

## 2017-07-03 ENCOUNTER — Ambulatory Visit (INDEPENDENT_AMBULATORY_CARE_PROVIDER_SITE_OTHER): Payer: BLUE CROSS/BLUE SHIELD | Admitting: Family Medicine

## 2017-07-03 ENCOUNTER — Encounter: Payer: Self-pay | Admitting: Family Medicine

## 2017-07-03 VITALS — BP 129/81 | HR 84 | Temp 98.4°F | Resp 20 | Ht 71.0 in | Wt 167.0 lb

## 2017-07-03 DIAGNOSIS — J0100 Acute maxillary sinusitis, unspecified: Secondary | ICD-10-CM | POA: Diagnosis not present

## 2017-07-03 DIAGNOSIS — J301 Allergic rhinitis due to pollen: Secondary | ICD-10-CM | POA: Diagnosis not present

## 2017-07-03 MED ORDER — AMOXICILLIN-POT CLAVULANATE 875-125 MG PO TABS: 1.0000 | ORAL_TABLET | Freq: Two times a day (BID) | ORAL | 0 refills | Status: DC

## 2017-07-03 NOTE — Progress Notes (Signed)
Brian Mills. , 11-08-1990, 27 y.o., male MRN: [id number] Patient Care Team    Relationship Specialty Notifications Start End  McGowen, Maryjean Morn, MD PCP - General Family Medicine  01/19/12   Anson Fret, MD Consulting Physician Neurology  03/08/14   Monica Becton, MD Consulting Physician Sports Medicine  06/27/15     Chief Complaint  Patient presents with  . URI    cough,congestion x 2 months     Subjective: Pt presents for an OV with complaints of Cough and congestion of 2 months duration.  Associated symptoms include change in cough character to phlegm production, cough worse at night and waking him, fatigue. He was seen 1 month ago by another provider and received Prednisone taper, singulair and tessalon perles. He is not taking Singulair he did not feel it worked. He had been taking zyrtec until 2 days ago. He reports he felt better during prednisone use, but then symptoms worsened. He denies fever, chills, nausea or rash.  Pt has tried mucinex DM to ease their symptoms.   Depression screen PHQ 2/9 06/02/2017  Decreased Interest 0  Down, Depressed, Hopeless 0  PHQ - 2 Score 0    No Known Allergies Social History   Tobacco Use  . Smoking status: Former Smoker    Types: Cigarettes    Last attempt to quit: 04/15/2015    Years since quitting: 2.2  . Smokeless tobacco: Current User    Types: Chew  Substance Use Topics  . Alcohol use: Yes    Alcohol/week: 0.0 oz    Comment: Occasionally   Past Medical History:  Diagnosis Date  . Acne vulgaris 11/16/2012   Adapalene -benz perox helped clear this up   . Cervical radiculopathy at C6 02/2014   Left C6-C7 (NCS and EMG at [facility] for left arm paresthesias)  . Cholelithiasis    asymptomatic  . 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  MRI 11/17/16--L5/S1 DDD with herniation indenting thecal sac and bilat S1 nerve sheaths--no nerve compression.  ESI fall 2018 helpful for shortterm--plan is to  repeat this and possibly get facet jt injections in future.  . Finger injury 01/2015   L long finger laceration; repaired in ED; followed up by [facility]--no apparent neurovascular injury.  . Generalized anxiety disorder 04/06/2013  . Nephrolithiasis 09/2011   ED visit; left 3mm ureteral stone with slight hydroureter, right sided nonobstructing stone.  . Tobacco dependence 05/09/2013   History reviewed. No pertinent surgical history. Family History  Problem Relation Age of Onset  . Hypertension Maternal Grandfather   . Arthritis Maternal Grandfather    Allergies as of 07/03/2017   No Known Allergies     Medication List        Accurate as of 07/03/17  9:48 AM. Always use your most recent med list.          benzonatate 200 MG capsule Commonly known as:  TESSALON Take 1 capsule (200 mg total) by mouth 3 (three) times daily as needed for cough.   montelukast 10 MG tablet Commonly known as:  SINGULAIR Take 1 tablet (10 mg total) by mouth at bedtime.       All past medical history, surgical history, allergies, family history, immunizations andmedications were updated in the EMR today and reviewed under the history and medication portions of their EMR.     ROS: Negative, with the exception of above mentioned in HPI   Objective:  BP 129/81 (  BP Location: Right Arm, Patient Position: Sitting, Cuff Size: Normal)   Pulse 84   Temp 98.4 F (36.9 C)   Resp 20   Ht 5\' 11"  (1.803 m)   Wt 167 lb (75.8 kg)   SpO2 97%   BMI 23.29 kg/m  Body mass index is 23.29 kg/m. Gen: Afebrile. No acute distress. Nontoxic in appearance, well developed, well nourished.  HENT: AT. [place]. Bilateral TM visualized with fullness/fluid, no erythema. MMM, no oral lesions. Bilateral nares with erythema, drainage and swelling. Throat without erythema or exudates. Cough, hoarseness and TTP sinus.  Eyes:Pupils Equal Round Reactive to light, Extraocular movements intact,  Conjunctiva without redness,  discharge or icterus. Neck/lymp/endocrine: Supple,no lymphadenopathy CV: RRR  Chest: CTAB, no wheeze or crackles. Good air movement, normal resp effort.  Skin: no rashes, purpura or petechiae.  Neuro:  Normal gait. PERLA. EOMi. Alert. Oriented x3   No exam data present No results found. No results found for this or any previous visit (from the past 24 hour(s)).  Assessment/Plan: Brian RubensteinRodney Veitch Jr. is a 27 y.o. male present for OV for  Acute maxillary sinusitis, recurrence not specified Seasonal allergic rhinitis due to pollen Rest, hydrate.  OTC + flonase everyday, mucinex (DM if cough) at night until symptoms stop. Start Xyzal and take at night. You can continue to take Singulair also before bed (they work well together).  Augmentin prescribed, take until completed.  If cough present it can last up to 6-8 weeks.  F/U 2 weeks of not improved.    Reviewed expectations re: course of current medical issues.  Discussed self-management of symptoms.  Outlined signs and symptoms indicating need for more acute intervention.  Patient verbalized understanding and all questions were answered.  Patient received an After-Visit Summary.    No orders of the defined types were placed in this encounter.    Note is dictated utilizing voice recognition software. Although note has been proof read prior to signing, occasional typographical errors still can be missed. If any questions arise, please do not hesitate to call for verification.   electronically signed by:  Felix Pacinienee Kuneff, DO  [facility] - [place]

## 2017-07-03 NOTE — Patient Instructions (Signed)
Rest, hydrate.  OTC: + flonase everyday, mucinex (DM if cough) at night until symptoms stop. Start Xyzal and take at night. You can continue to take Singulair also before bed (they work well together).  Augmentin prescribed, take until completed.  If cough present it can last up to 6-8 weeks.  F/U 2 weeks of not improved.    Continue allergy regimen until after Easter.

## 2017-07-06 ENCOUNTER — Ambulatory Visit: Payer: BLUE CROSS/BLUE SHIELD | Admitting: Family Medicine

## 2017-07-20 ENCOUNTER — Encounter: Payer: Self-pay | Admitting: Family Medicine

## 2017-07-20 NOTE — Telephone Encounter (Signed)
FYI

## 2017-07-21 ENCOUNTER — Ambulatory Visit: Payer: BLUE CROSS/BLUE SHIELD | Admitting: Family Medicine

## 2017-07-21 ENCOUNTER — Telehealth: Payer: Self-pay | Admitting: Family Medicine

## 2017-07-21 ENCOUNTER — Encounter: Payer: Self-pay | Admitting: Family Medicine

## 2017-07-21 VITALS — BP 106/64 | HR 63 | Resp 16 | Ht 71.0 in | Wt 168.0 lb

## 2017-07-21 DIAGNOSIS — J4531 Mild persistent asthma with (acute) exacerbation: Secondary | ICD-10-CM

## 2017-07-21 DIAGNOSIS — J301 Allergic rhinitis due to pollen: Secondary | ICD-10-CM

## 2017-07-21 MED ORDER — PREDNISONE 20 MG PO TABS: ORAL_TABLET | ORAL | 0 refills | Status: DC

## 2017-07-21 MED ORDER — FLUTICASONE PROPIONATE HFA 110 MCG/ACT IN AERO: 2.0000 | INHALATION_SPRAY | Freq: Two times a day (BID) | RESPIRATORY_TRACT | 3 refills | Status: DC

## 2017-07-21 MED ORDER — ALBUTEROL SULFATE HFA 108 (90 BASE) MCG/ACT IN AERS: 2.0000 | INHALATION_SPRAY | Freq: Four times a day (QID) | RESPIRATORY_TRACT | 0 refills | Status: DC | PRN

## 2017-07-21 NOTE — Telephone Encounter (Signed)
Copied from CRM [id number]. Topic: Quick Communication - See Telephone Encounter >> Jul 21, 2017 10:38 AM Waymon AmatoBurton, Donna F wrote: CRM for notification. See Telephone encounter for: 07/21/17. Pt saw dr Milinda Cavemcgowen and got an rx for flovent and it was too expensive and they need a new rx  or what can be done next  [facility] [place]  Best number [phone]

## 2017-07-21 NOTE — Telephone Encounter (Signed)
Flovent too expensive, will need a replacement sent to the pharmacy.

## 2017-07-21 NOTE — Progress Notes (Signed)
OFFICE VISIT  07/21/2017   CC:  Chief Complaint  Patient presents with  . Cough    ? asthma   HPI:    Patient is a 27 y.o. Caucasian male who presents for cough. Couple months of cough, sometimes rattly and sometimes dry, feeling intermittent SOB, +wheezing, tightness in chest.  Sx's worse in morning, sometimes at night as well.  Strong smells trigger worsening. URI/Allergies: limited to recent clear runny nose.  No fevers. This has been improved on flonase, singular, and xyzal. Hx of asthma as a child. Using albuterol inhaler: 2 times per day lately.  Feels relief from inhaler. Most recent albuterol use was 3.5 hours ago.  No malaise.  Past Medical History:  Diagnosis Date  . Acne vulgaris 11/16/2012   Adapalene -benz perox helped clear this up   . Cervical radiculopathy at C6 02/2014   Left C6-C7 (NCS and EMG at [facility] for left arm paresthesias)  . Cholelithiasis    asymptomatic  . 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  MRI 11/17/16--L5/S1 DDD with herniation indenting thecal sac and bilat S1 nerve sheaths--no nerve compression.  ESI fall 2018 helpful for shortterm--plan is to repeat this and possibly get facet jt injections in future.  . Finger injury 01/2015   L long finger laceration; repaired in ED; followed up by [facility]--no apparent neurovascular injury.  . Generalized anxiety disorder 04/06/2013  . Nephrolithiasis 09/2011   ED visit; left 3mm ureteral stone with slight hydroureter, right sided nonobstructing stone.  . Tobacco dependence 05/09/2013    History reviewed. No pertinent surgical history.  Outpatient Medications Prior to Visit  Medication Sig Dispense Refill  . dextromethorphan-guaiFENesin (MUCINEX DM) 30-600 MG 12hr tablet Take 1 tablet by mouth 2 (two) times daily.    Marland Kitchen levocetirizine (XYZAL) 5 MG tablet Take 5 mg by mouth every evening.    . montelukast (SINGULAIR) 10 MG tablet Take 1 tablet (10 mg total) by mouth at bedtime. 30  tablet 3  . amoxicillin-clavulanate (AUGMENTIN) 875-125 MG tablet Take 1 tablet by mouth 2 (two) times daily. (Patient not taking: Reported on 07/21/2017) 20 tablet 0  . benzonatate (TESSALON) 200 MG capsule Take 1 capsule (200 mg total) by mouth 3 (three) times daily as needed for cough. (Patient not taking: Reported on 07/21/2017) 20 capsule 0   No facility-administered medications prior to visit.     No Known Allergies  ROS As per HPI  PE: Blood pressure 106/64, pulse 63, resp. rate 16, height 5\' 11"  (1.803 m), weight 168 lb (76.2 kg), SpO2 98 %. Gen: Alert, well appearing.  Patient is oriented to person, place, time, and situation. AFFECT: pleasant, lucid thought and speech. ENT: Ears: EACs clear, normal epithelium.  TMs with good light reflex and landmarks bilaterally.  Eyes: no injection, icteris, swelling, or exudate.  EOMI, PERRLA. Nose: no drainage or turbinate edema/swelling.  No injection or focal lesion.  Mouth: lips without lesion/swelling.  Oral mucosa pink and moist.  Dentition intact and without obvious caries or gingival swelling.  Oropharynx without erythema, exudate, or swelling.  Neck - No masses or thyromegaly or limitation in range of motion CV: RRR, no m/r/g.   LUNGS: CTA bilat, nonlabored resps, good aeration in all lung fields.. EXT: no clubbing, cyanosis, or edema.  Skin: no rash.  LABS:    Chemistry      Component Value Date/Time   NA 140 04/28/2017 1132   K 4.8 04/28/2017 1132   CL  104 04/28/2017 1132   CO2 31 04/28/2017 1132   BUN 13 04/28/2017 1132   CREATININE 1.29 04/28/2017 1132      Component Value Date/Time   CALCIUM 9.3 04/28/2017 1132   ALKPHOS 72 04/28/2017 1132   AST 58 (H) 04/28/2017 1132   ALT 84 (H) 04/28/2017 1132   BILITOT 1.0 04/28/2017 1132       IMPRESSION AND PLAN:  1) Persistent asthma, unclear severity at this time; suspect his resp infection a few months ago brought this out of it's "quiescent" stage. Discussed dx, use of  daily steroid inhaler for now--rx'd flovent HFA 110 mcg, 2 p bid.  Nurse did inhaler education today.  RF'd albuterol HFA. I did also do a prednisone burst today: 40mg  qd x 5d.  2) Allergic rhinitis: much improved on flonase, xyzal, and singulair.  An After Visit Summary was printed and given to the patient.  FOLLOW UP: Return in about 1 month (around 08/18/2017) for f/u asthma.  Signed:  Santiago BumpersPhil McGowen, MD           07/21/2017

## 2017-07-22 ENCOUNTER — Other Ambulatory Visit: Payer: Self-pay | Admitting: Family Medicine

## 2017-07-22 MED ORDER — MOMETASONE FUROATE 200 MCG/ACT IN AERO: 2.0000 | INHALATION_SPRAY | Freq: Two times a day (BID) | RESPIRATORY_TRACT | 6 refills | Status: DC

## 2017-07-22 NOTE — Telephone Encounter (Signed)
Pts mother advised and voiced understanding, okay per DPR.  

## 2017-07-22 NOTE — Telephone Encounter (Signed)
OK, will eRx asmanex.

## 2017-07-22 NOTE — Telephone Encounter (Signed)
Percival SpanishKennedy, Cheryl W 07/22/2017 09:48 AM   Mom caleld back she contacted the insurance company ,[facility]   [facility]  [address] [place]

## 2017-07-22 NOTE — Telephone Encounter (Signed)
Please advise. Thanks.  

## 2017-07-22 NOTE — Telephone Encounter (Signed)
SW pts mother and advised her to have pt contact his insurance company and find out what they prefer. Okay per DPR. She will call back with this information.

## 2017-08-28 ENCOUNTER — Other Ambulatory Visit: Payer: Self-pay | Admitting: Family Medicine

## 2017-11-24 ENCOUNTER — Other Ambulatory Visit: Payer: Self-pay | Admitting: Family Medicine

## 2017-11-24 NOTE — Telephone Encounter (Signed)
Will authorize singulair RF x 3. Pt needs asthma f/u appt before running out of this current renewal.

## 2018-02-25 ENCOUNTER — Other Ambulatory Visit: Payer: Self-pay | Admitting: Family Medicine

## 2018-05-14 ENCOUNTER — Ambulatory Visit: Payer: BLUE CROSS/BLUE SHIELD | Admitting: Family Medicine

## 2018-06-09 ENCOUNTER — Ambulatory Visit: Payer: BLUE CROSS/BLUE SHIELD | Admitting: Family Medicine

## 2018-06-14 ENCOUNTER — Encounter: Payer: Self-pay | Admitting: Family Medicine

## 2018-06-14 ENCOUNTER — Ambulatory Visit: Payer: BLUE CROSS/BLUE SHIELD | Admitting: Family Medicine

## 2018-06-14 VITALS — BP 137/82 | HR 82 | Temp 97.6°F | Resp 16 | Ht 71.0 in | Wt 174.4 lb

## 2018-06-14 DIAGNOSIS — R740 Nonspecific elevation of levels of transaminase and lactic acid dehydrogenase [LDH]: Secondary | ICD-10-CM | POA: Diagnosis not present

## 2018-06-14 DIAGNOSIS — Z23 Encounter for immunization: Secondary | ICD-10-CM | POA: Diagnosis not present

## 2018-06-14 DIAGNOSIS — K580 Irritable bowel syndrome with diarrhea: Secondary | ICD-10-CM

## 2018-06-14 DIAGNOSIS — K591 Functional diarrhea: Secondary | ICD-10-CM

## 2018-06-14 DIAGNOSIS — R7401 Elevation of levels of liver transaminase levels: Secondary | ICD-10-CM

## 2018-06-14 LAB — COMPREHENSIVE METABOLIC PANEL
ALBUMIN: 4.7 g/dL (ref 3.5–5.2)
ALT: 17 U/L (ref 0–53)
AST: 17 U/L (ref 0–37)
Alkaline Phosphatase: 45 U/L (ref 39–117)
BUN: 17 mg/dL (ref 6–23)
CO2: 27 mEq/L (ref 19–32)
Calcium: 9.8 mg/dL (ref 8.4–10.5)
Chloride: 105 mEq/L (ref 96–112)
Creatinine, Ser: 1.35 mg/dL (ref 0.40–1.50)
GFR: 63.91 mL/min (ref >60.00)
Glucose, Bld: 88 mg/dL (ref 70–99)
POTASSIUM: 4.2 meq/L (ref 3.5–5.1)
SODIUM: 140 meq/L (ref 135–145)
TOTAL PROTEIN: 7 g/dL (ref 6.0–8.3)
Total Bilirubin: 1.1 mg/dL (ref 0.2–1.2)

## 2018-06-14 LAB — CBC WITH DIFFERENTIAL/PLATELET
Basophils Absolute: 0.1 10*3/uL (ref 0.0–0.1)
Basophils Relative: 1.2 % (ref 0.0–3.0)
EOS PCT: 6.2 % — AB (ref 0.0–5.0)
Eosinophils Absolute: 0.3 10*3/uL (ref 0.0–0.7)
HCT: 44.8 % (ref 39.0–52.0)
HEMOGLOBIN: 15.6 g/dL (ref 13.0–17.0)
Lymphocytes Relative: 40.5 % (ref 12.0–46.0)
Lymphs Abs: 2 10*3/uL (ref 0.7–4.0)
MCHC: 34.8 g/dL (ref 30.0–36.0)
MCV: 89.5 fl (ref 78.0–100.0)
MONOS PCT: 12.4 % — AB (ref 3.0–12.0)
Monocytes Absolute: 0.6 10*3/uL (ref 0.1–1.0)
Neutro Abs: 2 10*3/uL (ref 1.4–7.7)
Neutrophils Relative %: 39.7 % — ABNORMAL LOW (ref 43.0–77.0)
Platelets: 193 10*3/uL (ref 150.0–400.0)
RBC: 5.01 Mil/uL (ref 4.22–5.81)
RDW: 12.3 % (ref 11.5–15.5)
WBC: 5 10*3/uL (ref 4.0–10.5)

## 2018-06-14 LAB — IGA: IgA: 187 mg/dL (ref 68–378)

## 2018-06-14 LAB — SEDIMENTATION RATE: Sed Rate: 4 mm/hr (ref 0–15)

## 2018-06-14 MED ORDER — MONTELUKAST SODIUM 10 MG PO TABS: 10.0000 mg | ORAL_TABLET | Freq: Every day | ORAL | 3 refills | Status: DC

## 2018-06-14 MED ORDER — ALBUTEROL SULFATE HFA 108 (90 BASE) MCG/ACT IN AERS: 2.0000 | INHALATION_SPRAY | Freq: Four times a day (QID) | RESPIRATORY_TRACT | 0 refills | Status: DC | PRN

## 2018-06-14 MED ORDER — LEVOCETIRIZINE DIHYDROCHLORIDE 5 MG PO TABS: 5.0000 mg | ORAL_TABLET | Freq: Every evening | ORAL | 3 refills | Status: DC

## 2018-06-14 MED ORDER — HYOSCYAMINE SULFATE 0.125 MG SL SUBL: SUBLINGUAL_TABLET | SUBLINGUAL | 1 refills | Status: DC

## 2018-06-14 NOTE — Progress Notes (Signed)
OFFICE VISIT  06/14/2018   CC:  Chief Complaint  Patient presents with  . Follow-up    Asthma, Fasting, no complaints. Pt would like flu shot today    HPI:    Patient is a 28 y.o. Caucasian male who presents for f/u allergic rhinitis and asthma.  Doing well lately. Not requiring alb more than 2 days per month at this time. If misses singulair his chest feels a bit tight.  No signif allerg rhinitis troubles.  Lots of watery BMs: up to 6-7 per day some days.  One year hx. Sometimes postprandial and sometimes not.  Not able to attribute sx's to any particular food. Sometimes waxes and wanes with anxiety level.  +Bloating.  No n/v. No melena/hematochezia.  NO fevers.  No n/v. Some normal BMs dispersed in between periods of loose BMs.  Past Medical History:  Diagnosis Date  . Acne vulgaris 11/16/2012   Adapalene -benz perox helped clear this up   . Cervical radiculopathy at C6 02/2014   Left C6-C7 (NCS and EMG at [facility] for left arm paresthesias)  . Cholelithiasis    asymptomatic  . 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  MRI 11/17/16--L5/S1 DDD with herniation indenting thecal sac and bilat S1 nerve sheaths--no nerve compression.  ESI fall 2018 helpful for shortterm--plan is to repeat this and possibly get facet jt injections in future.  . Finger injury 01/2015   L long finger laceration; repaired in ED; followed up by [facility]--no apparent neurovascular injury.  . Generalized anxiety disorder 04/06/2013  . Nephrolithiasis 09/2011   ED visit; left 45m ureteral stone with slight hydroureter, right sided nonobstructing stone.  . Tobacco dependence 05/09/2013    History reviewed. No pertinent surgical history.  Outpatient Medications Prior to Visit  Medication Sig Dispense Refill  . albuterol (PROAIR HFA) 108 (90 Base) MCG/ACT inhaler Inhale 2 puffs into the lungs every 6 (six) hours as needed for wheezing or shortness of breath. 1 Inhaler 0  .  levocetirizine (XYZAL) 5 MG tablet Take 5 mg by mouth every evening.    . montelukast (SINGULAIR) 10 MG tablet Take 1 tablet (10 mg total) by mouth at bedtime. NEEDS OFFICE VISIT FOR MORE REFILLS. 30 tablet 0  . dextromethorphan-guaiFENesin (MUCINEX DM) 30-600 MG 12hr tablet Take 1 tablet by mouth 2 (two) times daily.    . Mometasone Furoate (ASMANEX HFA) 200 MCG/ACT AERO Inhale 2 puffs into the lungs 2 (two) times daily. (Patient not taking: Reported on 06/14/2018) 1 Inhaler 6  . predniSONE (DELTASONE) 20 MG tablet 2 tabs po qd x 5d (Patient not taking: Reported on 06/14/2018) 10 tablet 0   No facility-administered medications prior to visit.     No Known Allergies  ROS As per HPI  PE: Blood pressure 137/82, pulse 82, temperature 97.6 F (36.4 C), temperature source Oral, resp. rate 16, height _0  (1.803 m), weight 174 lb 6 oz (79.1 kg), SpO2 97 %. Gen: Alert, well appearing.  Patient is oriented to person, place, time, and situation. AFFECT: pleasant, lucid thought and speech. CV: RRR, no m/r/g.   LUNGS: CTA bilat, nonlabored resps, good aeration in all lung fields. ABD: soft, NT, ND, BS normal.  No hepatospenomegaly or mass.  No bruits. EXT: no clubbing or cyanosis.  no edema.    LABS:  Lab Results  Component Value Date   TSH 0.98 04/28/2017   Lab Results  Component Value Date   WBC 16.5 (H) 05/07/2015  HGB 15.6 05/07/2015   HCT 47.1 05/07/2015   MCV 90.3 05/07/2015   PLT 183.0 05/07/2015   Lab Results  Component Value Date   CREATININE 1.29 04/28/2017   BUN 13 04/28/2017   NA 140 04/28/2017   K 4.8 04/28/2017   CL 104 04/28/2017   CO2 31 04/28/2017   Lab Results  Component Value Date   ALT 84 (H) 04/28/2017   AST 58 (H) 04/28/2017   ALKPHOS 72 04/28/2017   BILITOT 1.0 04/28/2017   No results found for: CHOL No results found for: HDL No results found for: LDLCALC No results found for: TRIG No results found for: CHOLHDL No results found for:  PSA   IMPRESSION AND PLAN:  1) Allergic rhinitis: The current medical regimen is effective;  continue present plan and medications. RF'd singulair and xyzal today.  2) Mild persistent asthma: doing well with daily singulair. RF'd albuterol HFA today.  3) Functional diarrhea syndrome c/w IBS-D. Metamucil qd recommended.  Rx'd levsin 0.125, 1-2 q4h prn.  Therapeutic expectations and side effect profile of medication discussed today.  Patient's questions answered. Check CBC w/diff, CMET, TTG IgA, IgA total, and ESR today.  An After Visit Summary was printed and given to the patient.  FOLLOW UP: Return in about 4 weeks (around 07/12/2018) for f/u IBS.  Signed:  Crissie Sickles, MD           06/14/2018

## 2018-06-14 NOTE — Patient Instructions (Signed)
Take one dose of Metamucil fiber supplement every day.

## 2018-06-15 ENCOUNTER — Encounter: Payer: Self-pay | Admitting: *Deleted

## 2018-06-15 LAB — TISSUE TRANSGLUTAMINASE, IGA: (tTG) Ab, IgA: 1 U/mL

## 2018-06-17 ENCOUNTER — Encounter: Payer: Self-pay | Admitting: Sports Medicine

## 2018-06-17 ENCOUNTER — Ambulatory Visit (INDEPENDENT_AMBULATORY_CARE_PROVIDER_SITE_OTHER): Payer: BLUE CROSS/BLUE SHIELD | Admitting: Sports Medicine

## 2018-06-17 DIAGNOSIS — M5136 Other intervertebral disc degeneration, lumbar region with discogenic back pain only: Secondary | ICD-10-CM

## 2018-06-17 MED ORDER — CELECOXIB 200 MG PO CAPS: ORAL_CAPSULE | ORAL | 2 refills | Status: DC

## 2018-06-17 NOTE — Assessment & Plan Note (Signed)
Discogenic axial low back pain with an L5-S1 DDD with mild desiccation. Pain is predominantly axial and on the right. Epidurals have not worked well, his last epidural was in January 2019, provided to 7 months of relief. Repeating epidural. He does take an ibuprofen but complains that it does not last long enough so we will switch to Celebrex. He is going to get a cushioned lumbar support for his seat in the truck as well as look into a back brace to force appropriate posture. We did discuss the limitations of discectomy for axial back pain.

## 2018-06-17 NOTE — Progress Notes (Signed)
Subjective:    CC: Increasing low back pain  HPI: Brian Mills is a pleasant 28 year old male, he has a history of lumbar DDD, L5-S1 with a disc extrusion, as well as desiccation.  Pain is mostly right-sided and axial, discogenic.  He has had a couple of epidurals most recent of which was in January 2019, he had at least 7 months of relief with only a slow return of his pain.  No bowel or bladder dysfunction, saddle numbness, constitutional symptoms.  Main precipitants are sitting in the car, flexion, Valsalva.  Symptoms are moderate, worsening.  I reviewed the past medical history, family history, social history, surgical history, and allergies today and no changes were needed.  Please see the problem list section below in epic for further details.  Past Medical History: Past Medical History:  Diagnosis Date  . Acne vulgaris 11/16/2012   Adapalene -benz perox helped clear this up   . Cervical radiculopathy at C6 02/2014   Left C6-C7 (NCS and EMG at [facility] for left arm paresthesias)  . Cholelithiasis    asymptomatic  . 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  MRI 11/17/16--L5/S1 DDD with herniation indenting thecal sac and bilat S1 nerve sheaths--no nerve compression.  ESI fall 2018 helpful for shortterm--plan is to repeat this and possibly get facet jt injections in future.  . Finger injury 01/2015   L long finger laceration; repaired in ED; followed up by [facility]--no apparent neurovascular injury.  . Generalized anxiety disorder 04/06/2013  . Nephrolithiasis 09/2011   ED visit; left 61mm ureteral stone with slight hydroureter, right sided nonobstructing stone.  . Tobacco dependence 05/09/2013   Past Surgical History: No past surgical history on file. Social History: Social History   Socioeconomic History  . Marital status: Single    Spouse name: Not on file  . Number of children: Not on file  . Years of education: Not on file  . Highest education level: Not on file   Occupational History  . Not on file  Social Needs  . Financial resource strain: Not on file  . Food insecurity:    Worry: Not on file    Inability: Not on file  . Transportation needs:    Medical: Not on file    Non-medical: Not on file  Tobacco Use  . Smoking status: Current Every Day Smoker    Packs/day: 0.25    Types: Cigarettes  . Smokeless tobacco: Current User    Types: Chew  Substance and Sexual Activity  . Alcohol use: Yes    Alcohol/week: 0.0 standard drinks    Comment: Occasionally  . Drug use: No  . Sexual activity: Not on file  Lifestyle  . Physical activity:    Days per week: Not on file    Minutes per session: Not on file  . Stress: Not on file  Relationships  . Social connections:    Talks on phone: Not on file    Gets together: Not on file    Attends religious service: Not on file    Active member of club or organization: Not on file    Attends meetings of clubs or organizations: Not on file    Relationship status: Not on file  Other Topics Concern  . Not on file  Social History Narrative   Single, no children.  [employer] grad.   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   Tobacco: since age 50, 1 pack qod.  +Dip.  Alcohol: occ beer.   No hx of drug use/abuse.   Family History: Family History  Problem Relation Age of Onset  . Hypertension Maternal Grandfather   . Arthritis Maternal Grandfather    Allergies: No Known Allergies Medications: See med rec.  Review of Systems: No fevers, chills, night sweats, weight loss, chest pain, or shortness of breath.   Objective:    General: Well Developed, well nourished, and in no acute distress.  Neuro: Alert and oriented x3, extra-ocular muscles intact, sensation grossly intact.  HEENT: Normocephalic, atraumatic, pupils equal round reactive to light, neck supple, no masses, no lymphadenopathy, thyroid nonpalpable.  Skin: Warm and dry, no rashes. Cardiac: Regular rate and rhythm, no  murmurs rubs or gallops, no lower extremity edema.  Respiratory: Clear to auscultation bilaterally. Not using accessory muscles, speaking in full sentences.  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 Pain is predominantly axial and on the right. Epidurals have not worked well, his last epidural was in January 2019, provided to 7 months of relief. Repeating epidural. He does take an ibuprofen but complains that it does not last long enough so we will switch to Celebrex. He is going to get a cushioned lumbar support for his seat in the truck as well as look into a back brace to force appropriate posture. We did discuss the limitations of discectomy for axial back pain. ___________________________________________ Ihor Austin. Benjamin Stain, M.D., ABFM., CAQSM. Primary Care and Sports Medicine [facility]  Adjunct Professor of Family Medicine  [employer]

## 2018-06-21 ENCOUNTER — Ambulatory Visit
Admission: RE | Admit: 2018-06-21 | Discharge: 2018-06-21 | Disposition: A | Discharge status: Home / Self Care | Payer: BLUE CROSS/BLUE SHIELD | Source: Ambulatory Visit | Attending: Sports Medicine | Admitting: Sports Medicine

## 2018-06-21 DIAGNOSIS — M47817 Spondylosis without myelopathy or radiculopathy, lumbosacral region: Secondary | ICD-10-CM | POA: Diagnosis not present

## 2018-06-21 MED ORDER — IOPAMIDOL (ISOVUE-M 200) INJECTION 41%
1.0000 mL | Freq: Once | INTRAMUSCULAR | Status: AC
  Administered 2018-06-21: 1 mL via EPIDURAL

## 2018-06-21 MED ORDER — METHYLPREDNISOLONE ACETATE 40 MG/ML INJ SUSP (RADIOLOG
120.0000 mg | Freq: Once | INTRAMUSCULAR | Status: AC
  Administered 2018-06-21: 120 mg via EPIDURAL

## 2018-07-01 ENCOUNTER — Encounter: Payer: Self-pay | Admitting: Family Medicine

## 2018-07-01 ENCOUNTER — Ambulatory Visit (INDEPENDENT_AMBULATORY_CARE_PROVIDER_SITE_OTHER): Payer: BLUE CROSS/BLUE SHIELD | Admitting: Family Medicine

## 2018-07-01 VITALS — BP 113/75 | HR 91 | Temp 97.9°F | Resp 16 | Wt 173.6 lb

## 2018-07-01 DIAGNOSIS — J019 Acute sinusitis, unspecified: Secondary | ICD-10-CM

## 2018-07-01 NOTE — Progress Notes (Signed)
OFFICE VISIT  07/01/2018   CC: No chief complaint on file.    HPI:    Patient is a 28 y.o. Caucasian male who presents for 2 week long nasal congestion/runny nose, facial/sinus pressure, PND cough.  Describes "double sickening".  No ST, no HA, no fevers.  No wheezing or SOB. Mucinex mildly helpful.    Past Medical History:  Diagnosis Date  . Acne vulgaris 11/16/2012   Adapalene -benz perox helped clear this up   . Cervical radiculopathy at C6 02/2014   Left C6-C7 (NCS and EMG at [facility] for left arm paresthesias)  . Cholelithiasis    asymptomatic  . 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  MRI 11/17/16--L5/S1 DDD with herniation indenting thecal sac and bilat S1 nerve sheaths--no nerve compression.  ESI fall 2018 helpful for shortterm--plan is to repeat this and possibly get facet jt injections in future.  . Finger injury 01/2015   L long finger laceration; repaired in ED; followed up by [facility]--no apparent neurovascular injury.  . Generalized anxiety disorder 04/06/2013  . Nephrolithiasis 09/2011   ED visit; left 93mm ureteral stone with slight hydroureter, right sided nonobstructing stone.  . Tobacco dependence 05/09/2013    No past surgical history on file.  Outpatient Medications Prior to Visit  Medication Sig Dispense Refill  . albuterol (PROAIR HFA) 108 (90 Base) MCG/ACT inhaler Inhale 2 puffs into the lungs every 6 (six) hours as needed for wheezing or shortness of breath. 1 Inhaler 0  . celecoxib (CELEBREX) 200 MG capsule One to 2 tablets by mouth daily as needed for pain. 60 capsule 2  . hyoscyamine (LEVSIN SL) 0.125 MG SL tablet 1-2 tabs po q4h prn 30 tablet 1  . levocetirizine (XYZAL) 5 MG tablet Take 1 tablet (5 mg total) by mouth every evening. 90 tablet 3  . montelukast (SINGULAIR) 10 MG tablet Take 1 tablet (10 mg total) by mouth at bedtime. NEEDS OFFICE VISIT FOR MORE REFILLS. 90 tablet 3   No facility-administered medications prior to  visit.     No Known Allergies  ROS As per HPI  PE: There were no vitals taken for this visit. VS: noted--normal. Gen: alert, NAD, NONTOXIC APPEARING. HEENT: eyes without injection, drainage, or swelling.  Ears: EACs clear, TMs with normal light reflex and landmarks.  Nose: Clear rhinorrhea, with some dried, crusty exudate adherent to mildly injected mucosa.  No purulent d/c.  No paranasal sinus TTP.  No facial swelling.  Throat and mouth without focal lesion.  No pharyngial swelling, erythema, or exudate.   Neck: supple, no LAD.   LUNGS: CTA bilat, nonlabored resps.   CV: RRR, no m/r/g. EXT: no c/c/e SKIN: no rash   LABS:  none  IMPRESSION AND PLAN:  URI with cough, possible acute bacterial sinusitis. Z-pack, tessalon pearls, add saline nasal spray. Signs/symptoms to call or return for were reviewed and pt expressed understanding.  An After Visit Summary was printed and given to the patient.  FOLLOW UP: No follow-ups on file.  Signed:  Santiago Bumpers, MD           07/01/2018

## 2018-08-01 ENCOUNTER — Encounter: Payer: Self-pay | Admitting: Family Medicine

## 2018-08-04 ENCOUNTER — Encounter: Payer: Self-pay | Admitting: Family Medicine

## 2018-08-05 ENCOUNTER — Encounter: Payer: Self-pay | Admitting: Family Medicine

## 2018-08-05 NOTE — Telephone Encounter (Signed)
Letter printed,signed, and put on your desk.

## 2018-09-28 ENCOUNTER — Other Ambulatory Visit: Payer: Self-pay | Admitting: Family Medicine

## 2019-02-17 DIAGNOSIS — Z20828 Contact with and (suspected) exposure to other viral communicable diseases: Secondary | ICD-10-CM | POA: Diagnosis not present

## 2019-02-19 DIAGNOSIS — Z20828 Contact with and (suspected) exposure to other viral communicable diseases: Secondary | ICD-10-CM | POA: Diagnosis not present

## 2019-02-21 DIAGNOSIS — Z20828 Contact with and (suspected) exposure to other viral communicable diseases: Secondary | ICD-10-CM | POA: Diagnosis not present

## 2019-06-16 DIAGNOSIS — Z20828 Contact with and (suspected) exposure to other viral communicable diseases: Secondary | ICD-10-CM | POA: Diagnosis not present

## 2019-07-08 ENCOUNTER — Other Ambulatory Visit: Payer: Self-pay | Admitting: Family Medicine

## 2019-08-09 ENCOUNTER — Other Ambulatory Visit: Payer: Self-pay | Admitting: Family Medicine

## 2019-08-17 ENCOUNTER — Other Ambulatory Visit: Payer: Self-pay | Admitting: Family Medicine

## 2019-08-29 ENCOUNTER — Other Ambulatory Visit: Payer: Self-pay

## 2019-08-29 ENCOUNTER — Ambulatory Visit: Payer: BC Managed Care – PPO | Admitting: Family Medicine

## 2019-08-29 ENCOUNTER — Telehealth: Payer: Self-pay

## 2019-08-29 MED ORDER — ALBUTEROL SULFATE HFA 108 (90 BASE) MCG/ACT IN AERS: 2.0000 | INHALATION_SPRAY | Freq: Four times a day (QID) | RESPIRATORY_TRACT | 0 refills | Status: DC | PRN

## 2019-08-29 MED ORDER — MONTELUKAST SODIUM 10 MG PO TABS: 10.0000 mg | ORAL_TABLET | Freq: Every day | ORAL | 0 refills | Status: DC

## 2019-08-29 NOTE — Telephone Encounter (Signed)
Patient had appt today at 8am with Dr. Milinda Cave, see the following Appt notes: Med refill albuterol (PROAIR HFA). Provider out sick had to cancel appt 08/29/19. Because of patient's work schedule; he must have an early morning appt.   Patient will need to be advised that I have rescheduled his appt to  5/25 at 8AM. Please refill albuterol (PROAIR HFA)   [facility] [address], Please leave detailed message on his cell phone about appt info and refill   [phone]. Thank you.

## 2019-08-29 NOTE — Telephone Encounter (Signed)
Patient contacted and advised refills sent for singulair and inhaler until appt. Aware of appt info as well.

## 2019-09-13 ENCOUNTER — Ambulatory Visit: Payer: BC Managed Care – PPO | Admitting: Family Medicine

## 2019-09-13 ENCOUNTER — Other Ambulatory Visit: Payer: Self-pay | Admitting: Family Medicine

## 2019-09-13 ENCOUNTER — Encounter: Payer: Self-pay | Admitting: Family Medicine

## 2019-09-13 DIAGNOSIS — Z0289 Encounter for other administrative examinations: Secondary | ICD-10-CM

## 2019-09-13 NOTE — Progress Notes (Deleted)
OFFICE VISIT  09/13/2019   CC: No chief complaint on file.  HPI:    Patient is a 29 y.o. Caucasian male who presents for f/u intermittent asthma/mild persistent asthma as well as allergic rhinitis. I have not seen him for over a year.   Past Medical History:  Diagnosis Date  . Acne vulgaris 11/16/2012   Adapalene -benz perox helped clear this up   . Cervical radiculopathy at C6 02/2014   Left C6-C7 (NCS and EMG at [facility] for left arm paresthesias)  . Cholelithiasis    asymptomatic  . 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  MRI 11/17/16--L5/S1 DDD with herniation indenting thecal sac and bilat S1 nerve sheaths--no nerve compression.  ESI fall 2018 helpful for shortterm. Rpt 04/2017 helped x 7 mo.  . Finger injury 01/2015   L long finger laceration; repaired in ED; followed up by [facility]--no apparent neurovascular injury.  . Generalized anxiety disorder 04/06/2013  . Nephrolithiasis 09/2011   ED visit; left 23mm ureteral stone with slight hydroureter, right sided nonobstructing stone.  . Tobacco dependence 05/09/2013    No past surgical history on file.  Outpatient Medications Prior to Visit  Medication Sig Dispense Refill  . albuterol (PROAIR HFA) 108 (90 Base) MCG/ACT inhaler Inhale 2 puffs into the lungs every 6 (six) hours as needed for wheezing or shortness of breath. 8 g 0  . celecoxib (CELEBREX) 200 MG capsule One to 2 tablets by mouth daily as needed for pain. 60 capsule 2  . hyoscyamine (LEVSIN SL) 0.125 MG SL tablet 1-2 tabs po q4h prn 30 tablet 1  . levocetirizine (XYZAL) 5 MG tablet Take 1 tablet (5 mg total) by mouth every evening. 90 tablet 3  . montelukast (SINGULAIR) 10 MG tablet Take 1 tablet (10 mg total) by mouth at bedtime. NEEDS OFFICE VISIT FOR MORE REFILLS. 30 tablet 0   No facility-administered medications prior to visit.    No Known Allergies  ROS As per HPI  PE: There were no vitals taken for this visit. ***  LABS:  Lab  Results  Component Value Date   TSH 0.98 04/28/2017   Lab Results  Component Value Date   WBC 5.0 06/14/2018   HGB 15.6 06/14/2018   HCT 44.8 06/14/2018   MCV 89.5 06/14/2018   PLT 193.0 06/14/2018   Lab Results  Component Value Date   CREATININE 1.35 06/14/2018   BUN 17 06/14/2018   NA 140 06/14/2018   K 4.2 06/14/2018   CL 105 06/14/2018   CO2 27 06/14/2018   Lab Results  Component Value Date   ALT 17 06/14/2018   AST 17 06/14/2018   ALKPHOS 45 06/14/2018   BILITOT 1.1 06/14/2018   IMPRESSION AND PLAN:  No problem-specific Assessment & Plan notes found for this encounter.   An After Visit Summary was printed and given to the patient.  FOLLOW UP: No follow-ups on file.  Signed:  Santiago Bumpers, MD           09/13/2019

## 2019-12-18 ENCOUNTER — Other Ambulatory Visit: Payer: Self-pay | Admitting: Family Medicine

## 2020-02-04 IMAGING — DX DG CHEST 2V
2 series · 2 of 2 positions shown · non-contrast
Comparison: June 22, 2015

CLINICAL DATA: Cough for 3 weeks

EXAM:
CHEST  2 VIEW

[chest pa]
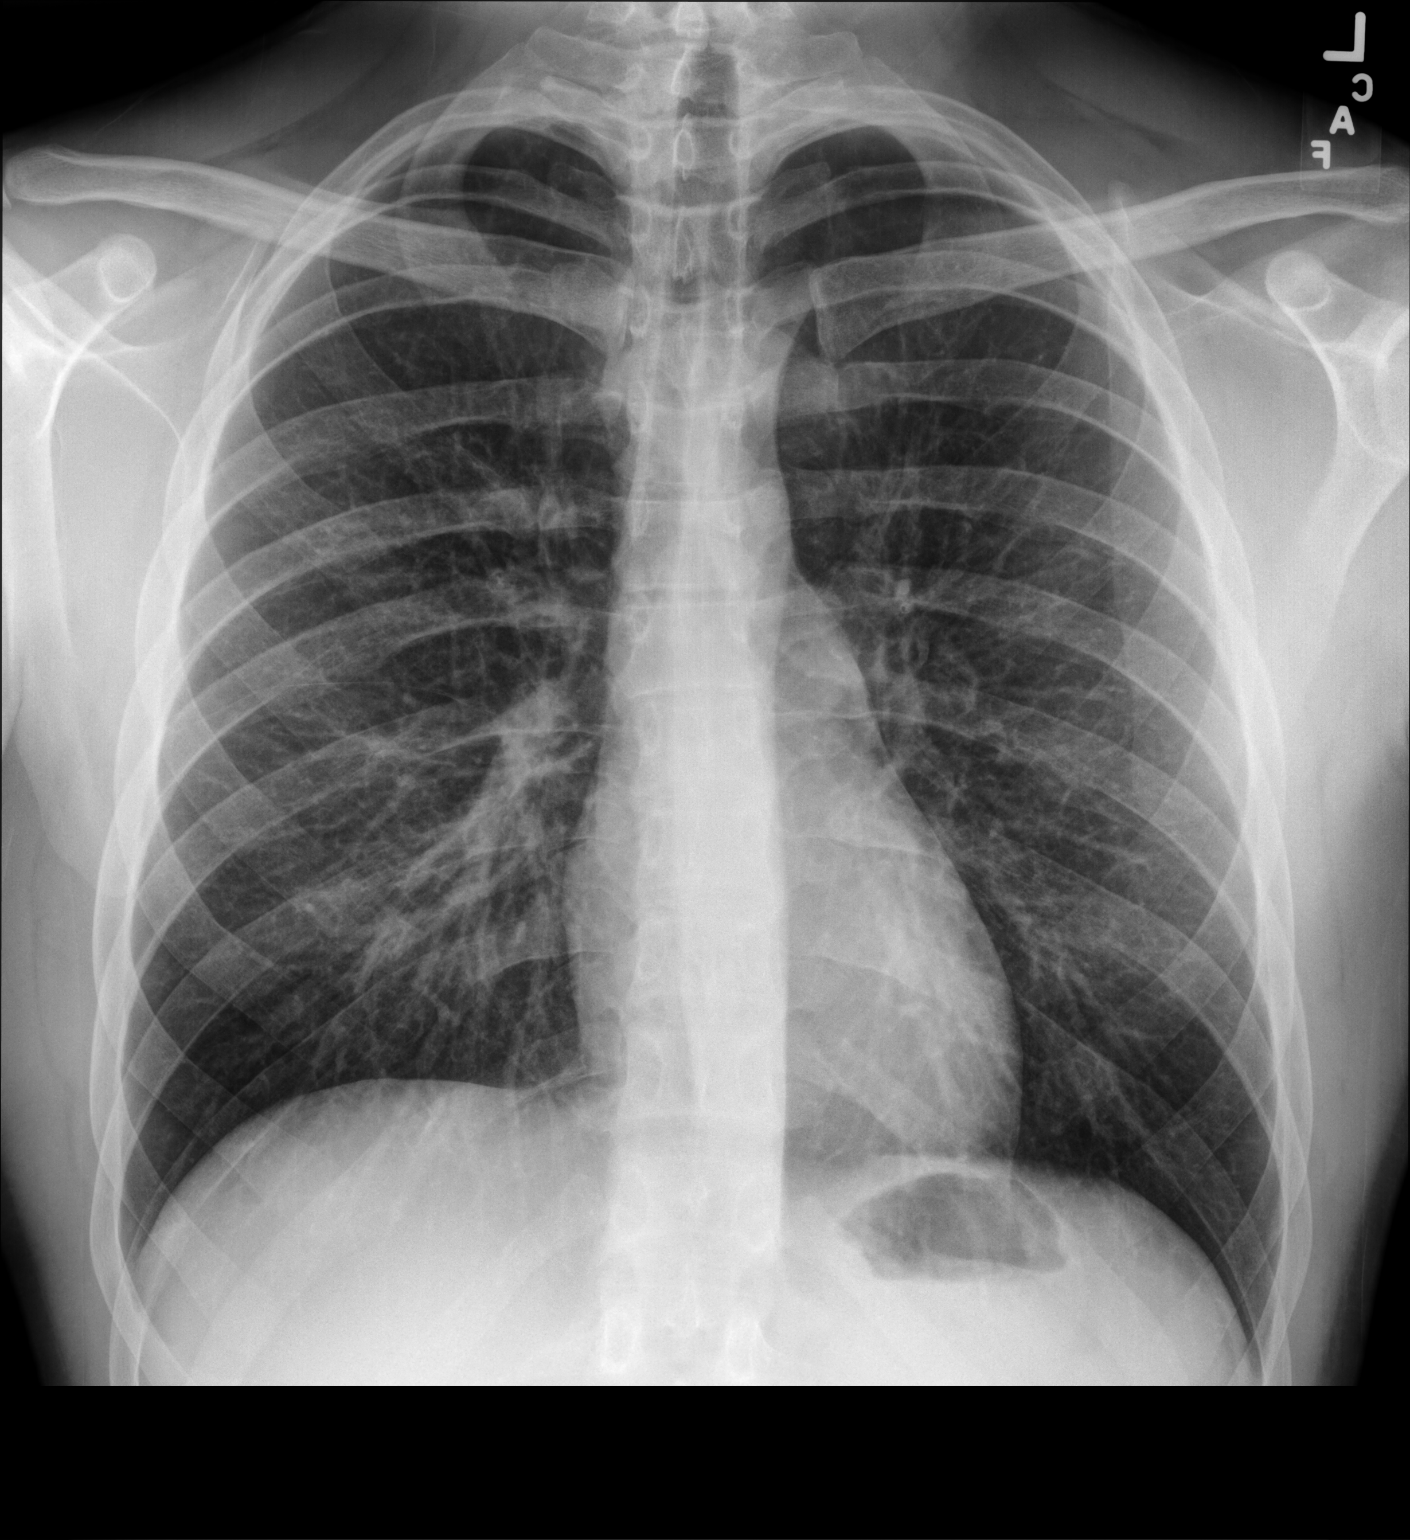

[chest lat]
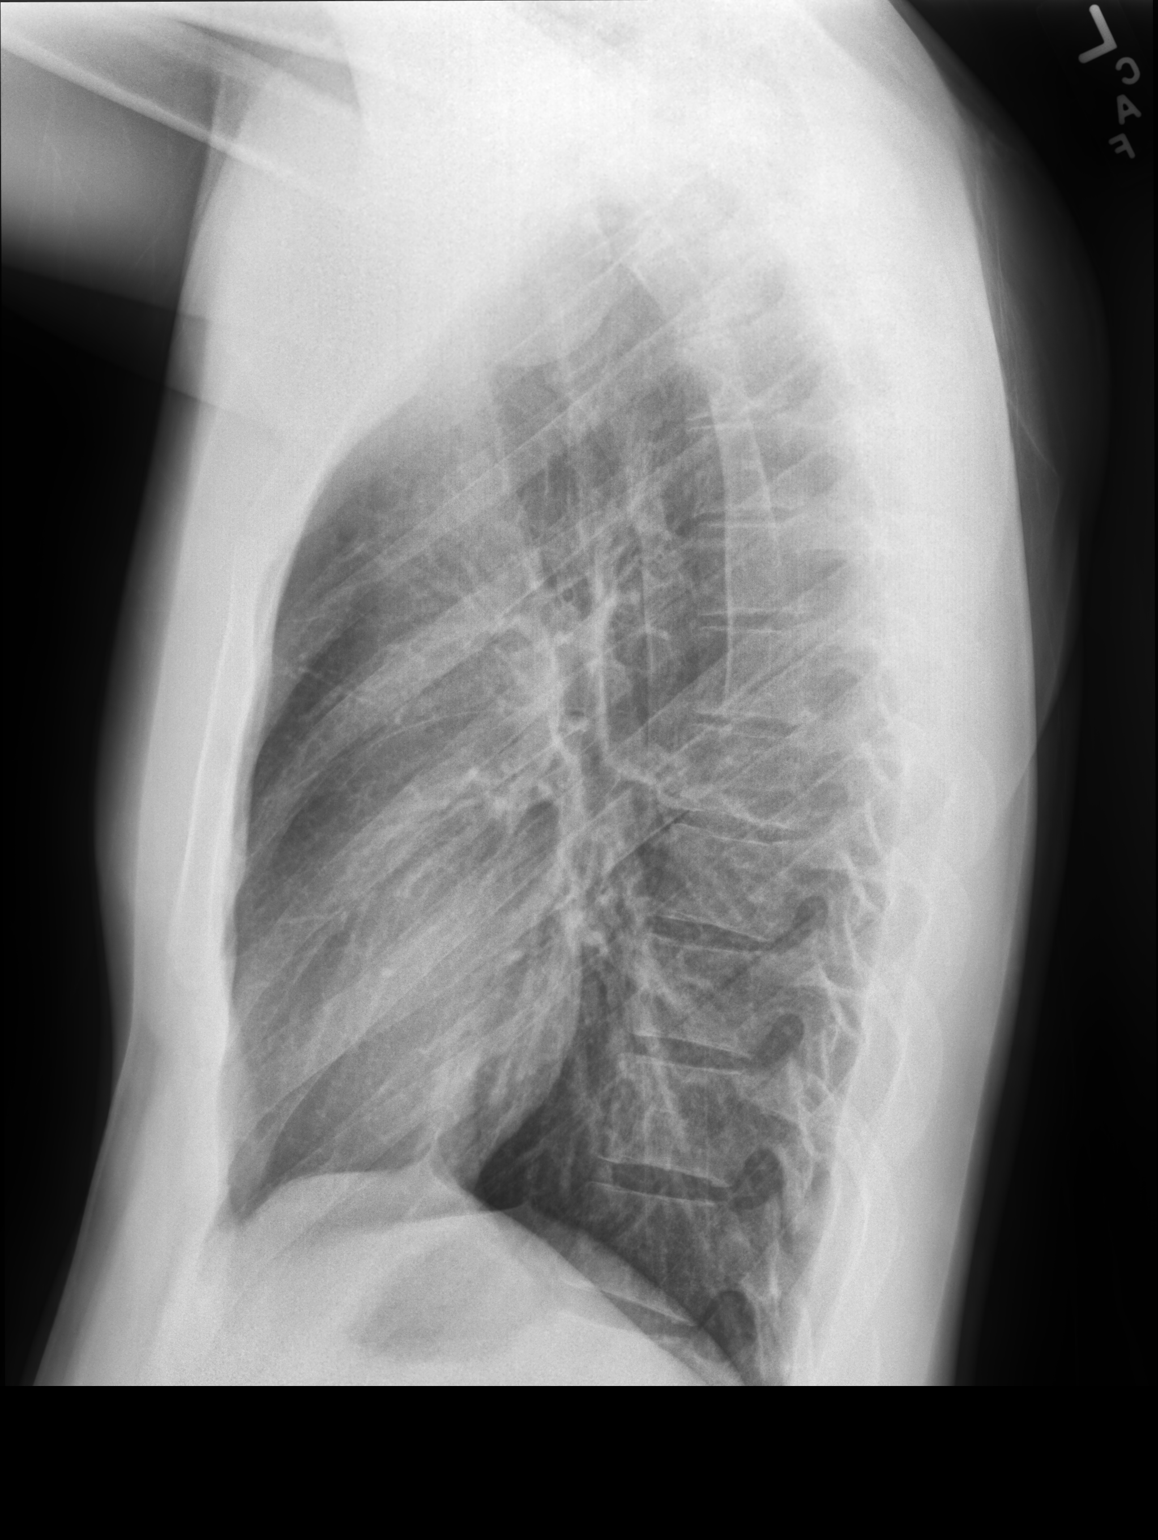

[2 of 2 positions shown; findings below may reference images not displayed]

FINDINGS: There is no edema or consolidation. The heart size and pulmonary
vascularity are normal. No adenopathy. No pneumothorax. No bone
lesions.
IMPRESSION: No edema or consolidation.

## 2020-06-14 ENCOUNTER — Other Ambulatory Visit: Payer: Self-pay | Admitting: Family Medicine

## 2020-07-17 ENCOUNTER — Other Ambulatory Visit: Payer: Self-pay | Admitting: Family Medicine

## 2020-10-15 ENCOUNTER — Emergency Department (HOSPITAL_BASED_OUTPATIENT_CLINIC_OR_DEPARTMENT_OTHER)
Admission: EM | Admit: 2020-10-15 | Discharge: 2020-10-15 | Disposition: A | Discharge status: Home / Self Care | Payer: BC Managed Care – PPO | Attending: Emergency Medicine | Admitting: Emergency Medicine

## 2020-10-15 ENCOUNTER — Emergency Department (HOSPITAL_BASED_OUTPATIENT_CLINIC_OR_DEPARTMENT_OTHER): Payer: BC Managed Care – PPO | Admitting: Radiology

## 2020-10-15 ENCOUNTER — Other Ambulatory Visit: Payer: Self-pay

## 2020-10-15 ENCOUNTER — Encounter (HOSPITAL_BASED_OUTPATIENT_CLINIC_OR_DEPARTMENT_OTHER): Payer: Self-pay | Admitting: Emergency Medicine

## 2020-10-15 DIAGNOSIS — Z87891 Personal history of nicotine dependence: Secondary | ICD-10-CM | POA: Insufficient documentation

## 2020-10-15 DIAGNOSIS — J4530 Mild persistent asthma, uncomplicated: Secondary | ICD-10-CM | POA: Diagnosis not present

## 2020-10-15 DIAGNOSIS — M25512 Pain in left shoulder: Secondary | ICD-10-CM | POA: Diagnosis not present

## 2020-10-15 DIAGNOSIS — M24812 Other specific joint derangements of left shoulder, not elsewhere classified: Secondary | ICD-10-CM | POA: Insufficient documentation

## 2020-10-15 MED ORDER — METHOCARBAMOL 500 MG PO TABS: 500.0000 mg | ORAL_TABLET | Freq: Two times a day (BID) | ORAL | 0 refills | Status: DC | PRN

## 2020-10-15 MED ORDER — IBUPROFEN 800 MG PO TABS: 800.0000 mg | ORAL_TABLET | Freq: Three times a day (TID) | ORAL | 0 refills | Status: DC

## 2020-10-15 MED ORDER — KETOROLAC TROMETHAMINE 30 MG/ML IJ SOLN
30.0000 mg | Freq: Once | INTRAMUSCULAR | Status: AC
  Administered 2020-10-15: 30 mg via INTRAMUSCULAR
  Filled 2020-10-15: qty 1

## 2020-10-15 MED ORDER — IBUPROFEN 800 MG PO TABS
800.0000 mg | ORAL_TABLET | Freq: Once | ORAL | Status: AC
  Administered 2020-10-15: 800 mg via ORAL
  Filled 2020-10-15: qty 1
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
## 2020-10-18 ENCOUNTER — Other Ambulatory Visit: Payer: Self-pay

## 2020-10-18 ENCOUNTER — Encounter: Payer: Self-pay | Admitting: Orthopaedic Surgery

## 2020-10-18 ENCOUNTER — Ambulatory Visit: Payer: BC Managed Care – PPO | Admitting: Orthopaedic Surgery

## 2020-10-18 DIAGNOSIS — S43002A Unspecified subluxation of left shoulder joint, initial encounter: Secondary | ICD-10-CM | POA: Diagnosis not present
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
## 2020-10-19 ENCOUNTER — Telehealth: Payer: Self-pay | Admitting: Orthopaedic Surgery

## 2020-10-19 ENCOUNTER — Encounter: Payer: Self-pay | Admitting: Orthopaedic Surgery

## 2020-10-19 NOTE — Telephone Encounter (Signed)
Ok to write letter for return to work as outlined-please call and ask him if he would need restrictions-thanks

## 2020-10-19 NOTE — Telephone Encounter (Signed)
Called patient's mother. No answer. Left message on voicemail that letter has been sent to email that was provided by receptionist.

## 2020-10-19 NOTE — Telephone Encounter (Signed)
Mom called. She would like the letter emailed to her [email or web address]. the letter should not have any restrictions.

## 2020-10-25 ENCOUNTER — Ambulatory Visit: Payer: BC Managed Care – PPO | Admitting: Orthopaedic Surgery

## 2021-01-23 ENCOUNTER — Encounter: Payer: Self-pay | Admitting: Orthopaedic Surgery

## 2021-01-24 ENCOUNTER — Other Ambulatory Visit: Payer: Self-pay

## 2021-01-24 DIAGNOSIS — M25512 Pain in left shoulder: Secondary | ICD-10-CM

## 2021-01-24 DIAGNOSIS — S43002A Unspecified subluxation of left shoulder joint, initial encounter: Secondary | ICD-10-CM

## 2021-01-24 DIAGNOSIS — G8929 Other chronic pain: Secondary | ICD-10-CM

## 2021-02-05 ENCOUNTER — Ambulatory Visit
Admission: RE | Admit: 2021-02-05 | Discharge: 2021-02-05 | Disposition: A | Discharge status: Home / Self Care | Payer: BC Managed Care – PPO | Source: Ambulatory Visit | Attending: Orthopaedic Surgery | Admitting: Orthopaedic Surgery

## 2021-02-05 ENCOUNTER — Other Ambulatory Visit: Payer: Self-pay

## 2021-02-05 DIAGNOSIS — G8929 Other chronic pain: Secondary | ICD-10-CM

## 2021-02-05 DIAGNOSIS — S43002A Unspecified subluxation of left shoulder joint, initial encounter: Secondary | ICD-10-CM

## 2021-02-05 DIAGNOSIS — M25512 Pain in left shoulder: Secondary | ICD-10-CM

## 2021-02-05 MED ORDER — IOPAMIDOL (ISOVUE-M 200) INJECTION 41%
15.0000 mL | Freq: Once | INTRAMUSCULAR | Status: AC
  Administered 2021-02-05: 15 mL via INTRA_ARTICULAR

## 2021-02-13 ENCOUNTER — Encounter: Payer: Self-pay | Admitting: Orthopaedic Surgery

## 2021-02-13 ENCOUNTER — Other Ambulatory Visit: Payer: Self-pay

## 2021-02-13 ENCOUNTER — Ambulatory Visit: Payer: BC Managed Care – PPO | Admitting: Orthopaedic Surgery

## 2021-02-13 DIAGNOSIS — S43002A Unspecified subluxation of left shoulder joint, initial encounter: Secondary | ICD-10-CM

## 2021-02-13 NOTE — Progress Notes (Signed)
Office Visit Note   Patient: [facility].           Date of Birth: March 06, 1991           MRN: [id number] Visit Date: 02/13/2021              Requested by: Jeoffrey Massed, MD [address] [place],  [place] [address] PCP: Jeoffrey Massed, MD   Assessment & Plan: Visit Diagnoses: No diagnosis found.  Plan: Patient is a pleasant 30 year old gentleman with a history of recurrent shoulder dislocation.  MRI arthrogram was reviewed today.  MRI demonstrated a Hill-Sachs deformity of 1.9 cm which is likely not engaging no Bankart.  Rotator cuff as expected is intact.  We will have patient engage with physical therapy to learn a home exercise program for shoulder stabilization.  Patient is in agreement with the plan understands he is at a high risk of recurrent dislocation if he does not work on shoulder stabilization  Follow-Up Instructions: No follow-ups on file.   Orders:  No orders of the defined types were placed in this encounter.  No orders of the defined types were placed in this encounter.     Procedures: No procedures performed   Clinical Data: No additional findings.   Subjective: No chief complaint on file. Patient presents today for follow up on his left shoulder. He had an MRI and is here today for those results.    Review of Systems  All other systems reviewed and are negative.   Objective: Vital Signs: There were no vitals taken for this visit.  Physical Exam Constitutional:      Appearance: Normal appearance.  Eyes:     Extraocular Movements: Extraocular movements intact.  Skin:    General: Skin is warm and dry.  Neurological:     General: No focal deficit present.     Mental Status: He is alert.  Psychiatric:        Mood and Affect: Mood normal.        Behavior: Behavior normal.    Ortho Exam Examination of his left shoulder.  Clinically appears normal.  He has good forward elevation to 180 degrees internal rotation to mid back.   Some mild pain with external rotation.  Strength is intact.  No swelling Specialty Comments:  No specialty comments available.  Imaging: No results found.   PMFS History: Patient Active Problem List   Diagnosis Date Noted   Shoulder subluxation, left 10/18/2020   Pes cavus 11/12/2016   Discogenic low back pain 10/09/2016   Winged scapula of right side 06/22/2015   Acute tonsillitis 05/07/2015   Maxillary sinusitis, acute 04/11/2015   Cervical radiculitis 11/28/2013   Tobacco dependence 05/09/2013   Generalized anxiety disorder 04/06/2013   Past Medical History:  Diagnosis Date   Acne vulgaris 11/16/2012   Adapalene -benz perox helped clear this up    Allergic rhinitis    Cervical radiculopathy at C6 02/2014   Left C6-C7 (NCS and EMG at [facility] for left arm paresthesias)   Cholelithiasis    asymptomatic   Discogenic low back pain 09/2016   Sports Med MD.  MRI 11/17/16--L5/S1 DDD with herniation indenting thecal sac and bilat S1 nerve sheaths--no nerve compression.  ESI fall 2018 helpful for shortterm. Rpt 04/2017 helped x 7 mo.   Finger injury 01/2015   L long finger laceration; repaired in ED; followed up by [facility]--no apparent neurovascular injury.   Generalized anxiety disorder 04/06/2013  Mild persistent asthma    Nephrolithiasis 09/2011   ED visit; left 36mm ureteral stone with slight hydroureter, right sided nonobstructing stone.   Tobacco dependence 05/09/2013    Family History  Problem Relation Age of Onset   Hypertension Maternal Grandfather    Arthritis Maternal Grandfather     No past surgical history on file. Social History   Occupational History   Not on file  Tobacco Use   Smoking status: Former    Packs/day: 0.25    Types: Cigarettes   Smokeless tobacco: Former    Types: Associate Professor Use: Every day  Substance and Sexual Activity   Alcohol use: Yes    Alcohol/week: 0.0 standard drinks    Comment: Occasionally   Drug use:  No   Sexual activity: Not on file

## 2022-03-25 ENCOUNTER — Ambulatory Visit: Payer: BC Managed Care – PPO | Admitting: Family Medicine

## 2022-03-25 ENCOUNTER — Encounter: Payer: Self-pay | Admitting: Family Medicine

## 2022-03-25 VITALS — BP 120/76 | HR 82 | Temp 98.2°F | Ht 71.0 in | Wt 172.0 lb

## 2022-03-25 DIAGNOSIS — J329 Chronic sinusitis, unspecified: Secondary | ICD-10-CM | POA: Diagnosis not present

## 2022-03-25 DIAGNOSIS — B9689 Other specified bacterial agents as the cause of diseases classified elsewhere: Secondary | ICD-10-CM

## 2022-03-25 MED ORDER — AMOXICILLIN-POT CLAVULANATE 875-125 MG PO TABS: 1.0000 | ORAL_TABLET | Freq: Two times a day (BID) | ORAL | 0 refills | Status: DC

## 2022-03-25 MED ORDER — IPRATROPIUM BROMIDE 0.06 % NA SOLN: 2.0000 | Freq: Four times a day (QID) | NASAL | 0 refills | Status: DC

## 2022-03-25 MED ORDER — MONTELUKAST SODIUM 10 MG PO TABS: ORAL_TABLET | ORAL | 0 refills | Status: DC

## 2022-03-25 MED ORDER — ALBUTEROL SULFATE HFA 108 (90 BASE) MCG/ACT IN AERS
2.0000 | INHALATION_SPRAY | Freq: Four times a day (QID) | RESPIRATORY_TRACT | 0 refills | Status: DC | PRN
Start: 2022-03-25 — End: 2022-04-22

## 2022-03-25 NOTE — Patient Instructions (Addendum)
No follow-ups on file.        Great to see you today.  I have refilled the medication(s) we provide.   If labs were collected, we will inform you of lab results once received either by echart message or telephone call.   - echart message- for normal results that have been seen by the patient already.   - telephone call: abnormal results or if patient has not viewed results in their echart.  

## 2022-03-25 NOTE — Progress Notes (Signed)
Brian Mills. , 23-Mar-1991, 31 y.o., male MRN: [id number] Patient Care Team    Relationship Specialty Notifications Start End  McGowen, Maryjean Morn, MD PCP - General Family Medicine  01/19/12   Anson Fret, MD Consulting Physician Neurology  03/08/14   Monica Becton, MD Consulting Physician Sports Medicine  06/27/15     Chief Complaint  Patient presents with   Cough    Pt c/o cough, nasal congestion, sinus pain x 10 days     Subjective: Pt presents for an OV with complaints of cough, nasal congestion, chest congestion and now sinus pain. Symptoms started in his chest 10 day s ago.  He denies fever or chills. He has tried otc products to help with sinus and allergies.      03/25/2022    1:21 PM 06/14/2018    8:38 AM 06/02/2017    1:09 PM  Depression screen PHQ 2/9  Decreased Interest 0 0 0  Down, Depressed, Hopeless 0 0 0  PHQ - 2 Score 0 0 0    No Known Allergies Social History   Social History Narrative   Single, no children.  [employer] grad.   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   Tobacco: since age 66, 1 pack qod.  +Dip.   Alcohol: occ beer.   No hx of drug use/abuse.   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  MRI 11/17/16--L5/S1 DDD with herniation indenting thecal sac and bilat S1 nerve sheaths--no nerve compression.  ESI fall 2018 helpful for shortterm. Rpt 04/2017 helped x 7 mo.   Finger injury 01/2015   L long finger laceration; repaired in ED; followed up by [facility]--no apparent neurovascular injury.   Generalized anxiety disorder 04/06/2013   Mild persistent asthma    Nephrolithiasis 09/2011   ED visit; left 19mm ureteral stone  with slight hydroureter, right sided nonobstructing stone.   Tobacco dependence 05/09/2013   History reviewed. No pertinent surgical history. Family History  Problem Relation Age of Onset   Hypertension Maternal Grandfather    Arthritis Maternal Grandfather    Allergies as of 03/25/2022   No Known Allergies      Medication List        Accurate as of March 25, 2022  1:44 PM. If you have any questions, ask your nurse or doctor.          STOP taking these medications    celecoxib 200 MG capsule Commonly known as: CeleBREX Stopped by: Felix Pacini, DO   hyoscyamine 0.125 MG SL tablet Commonly known as: LEVSIN SL Stopped by: Felix Pacini, DO   ibuprofen 800 MG tablet Commonly known as: ADVIL Stopped by: Felix Pacini, DO   levocetirizine 5 MG tablet Commonly known as: XYZAL Stopped by: Felix Pacini, DO   methocarbamol 500 MG tablet Commonly known as: ROBAXIN Stopped by: Felix Pacini, DO       TAKE these medications    albuterol 108 (90 Base) MCG/ACT inhaler Commonly known as: ProAir HFA Inhale 2 puffs into the lungs every 6 (six) hours as needed for wheezing or shortness of breath.  amoxicillin-clavulanate 875-125 MG tablet Commonly known as: AUGMENTIN Take 1 tablet by mouth 2 (two) times daily. Started by: Felix Pacini, DO   ipratropium 0.06 % nasal spray Commonly known as: ATROVENT Place 2 sprays into both nostrils 4 (four) times daily. Started by: Felix Pacini, DO   montelukast 10 MG tablet Commonly known as: SINGULAIR TAKE 1 TABLET BY MOUTH AT BEDTIME. NEEDS OFFICE VISIT FOR MORE REFILLS.        All past medical history, surgical history, allergies, family history, immunizations andmedications were updated in the EMR today and reviewed under the history and medication portions of their EMR.     Review of Systems  Constitutional:  Negative for chills and fever.  HENT:  Positive for congestion, ear pain, sinus pain and sore throat. Negative  for ear discharge.   Respiratory:  Positive for cough.   Neurological:  Positive for headaches.   Negative, with the exception of above mentioned in HPI   Objective:  BP 120/76   Pulse 82   Temp 98.2 F (36.8 C) (Oral)   Ht 5\' 11"  (1.803 m)   Wt 172 lb (78 kg)   SpO2 99%   BMI 23.99 kg/m  Body mass index is 23.99 kg/m. Physical Exam Vitals and nursing note reviewed.  Constitutional:      General: He is not in acute distress.    Appearance: Normal appearance. He is not ill-appearing, toxic-appearing or diaphoretic.  HENT:     Head: Normocephalic and atraumatic.     Comments: TTP max. Sinus bilateral    Right Ear: Tympanic membrane and ear canal normal.     Left Ear: Tympanic membrane and ear canal normal.     Nose: Congestion and rhinorrhea present.     Mouth/Throat:     Mouth: Mucous membranes are moist.     Pharynx: Posterior oropharyngeal erythema present. No oropharyngeal exudate.  Eyes:     General: No scleral icterus.       Right eye: No discharge.        Left eye: No discharge.     Extraocular Movements: Extraocular movements intact.     Pupils: Pupils are equal, round, and reactive to light.  Cardiovascular:     Rate and Rhythm: Normal rate and regular rhythm.     Heart sounds: No murmur heard. Pulmonary:     Effort: No respiratory distress.     Breath sounds: Normal breath sounds. No wheezing, rhonchi or rales.  Musculoskeletal:     Cervical back: Neck supple.  Lymphadenopathy:     Cervical: Cervical adenopathy present.  Skin:    General: Skin is warm and dry.     Coloration: Skin is not jaundiced or pale.     Findings: No rash.  Neurological:     Mental Status: He is alert and oriented to person, place, and time. Mental status is at baseline.  Psychiatric:        Mood and Affect: Mood normal.        Behavior: Behavior normal.        Thought Content: Thought content normal.        Judgment: Judgment normal.    No results found. No results  found. No results found for this or any previous visit (from the past 24 hour(s)).  Assessment/Plan: Oneal Schoenberger. is a 31 y.o. male present for OV for  Bacterial sinusitis/cough/allergies Rest, hydrate.  +/- flonase, mucinex (DM if cough), nettie pot or nasal saline.  Atrovent nasal spray, albuterol  and Singulair prescribed/refilled.  Augmentin BID prescribed, take until completed.  F/U 2 weeks if not improved.   Reviewed expectations re: course of current medical issues. Discussed self-management of symptoms. Outlined signs and symptoms indicating need for more acute intervention. Patient verbalized understanding and all questions were answered. Patient received an After-Visit Summary.    No orders of the defined types were placed in this encounter.  Meds ordered this encounter  Medications   amoxicillin-clavulanate (AUGMENTIN) 875-125 MG tablet    Sig: Take 1 tablet by mouth 2 (two) times daily.    Dispense:  20 tablet    Refill:  0   ipratropium (ATROVENT) 0.06 % nasal spray    Sig: Place 2 sprays into both nostrils 4 (four) times daily.    Dispense:  15 mL    Refill:  0   montelukast (SINGULAIR) 10 MG tablet    Sig: TAKE 1 TABLET BY MOUTH AT BEDTIME. NEEDS OFFICE VISIT FOR MORE REFILLS.    Dispense:  30 tablet    Refill:  0   albuterol (PROAIR HFA) 108 (90 Base) MCG/ACT inhaler    Sig: Inhale 2 puffs into the lungs every 6 (six) hours as needed for wheezing or shortness of breath.    Dispense:  8 g    Refill:  0    May change to different albuterol HFA if insurer requires   Referral Orders  No referral(s) requested today     Note is dictated utilizing voice recognition software. Although note has been proof read prior to signing, occasional typographical errors still can be missed. If any questions arise, please do not hesitate to call for verification.   electronically signed by:  Felix Pacini, DO  [facility] [place]

## 2022-04-01 ENCOUNTER — Encounter: Payer: Self-pay | Admitting: Sports Medicine

## 2022-04-02 NOTE — Telephone Encounter (Signed)
This patient has not been seen in over 3 years making him a new patient, I cannot just call in something for a "new" patient.  He does have a primary care provider that he needs to reach out to for relief until he sees me.

## 2022-04-10 ENCOUNTER — Ambulatory Visit: Payer: BC Managed Care – PPO | Admitting: Sports Medicine

## 2022-04-10 DIAGNOSIS — M5136 Other intervertebral disc degeneration, lumbar region with discogenic back pain only: Secondary | ICD-10-CM

## 2022-04-10 MED ORDER — PREDNISONE 50 MG PO TABS: ORAL_TABLET | ORAL | 0 refills | Status: DC

## 2022-04-10 NOTE — Assessment & Plan Note (Signed)
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 Last epidural was in 2020, he continues to have worsening of axial pain after a couple epidurals back in 2020. He does desire repeat. No red flag symptoms, ordering repeat L5-S1 epidural, adding 5 days of prednisone to hold him over in the meantime, home conditioning, return to see me as needed.

## 2022-04-10 NOTE — Progress Notes (Signed)
    Procedures performed today:    None.  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 Last epidural was in 2020, he continues to have worsening of axial pain after a couple epidurals back in 2020. He does desire repeat. No red flag symptoms, ordering repeat L5-S1 epidural, adding 5 days of prednisone to hold him over in the meantime, home conditioning, return to see me as needed.  Chronic process with exacerbation and pharmacologic intervention  ____________________________________________ Ihor Austin. Benjamin Stain, M.D., ABFM., CAQSM., AME. Primary Care and Sports Medicine [facility]  Adjunct Professor of Family Medicine  [employer]  Restaurant manager, fast food

## 2022-04-16 ENCOUNTER — Other Ambulatory Visit: Payer: Self-pay | Admitting: Family Medicine

## 2022-04-21 ENCOUNTER — Other Ambulatory Visit: Payer: Self-pay | Admitting: Family Medicine

## 2022-04-28 ENCOUNTER — Ambulatory Visit
Admission: RE | Admit: 2022-04-28 | Discharge: 2022-04-28 | Disposition: A | Discharge status: Home / Self Care | Payer: BC Managed Care – PPO | Source: Ambulatory Visit | Attending: Sports Medicine | Admitting: Sports Medicine

## 2022-04-28 DIAGNOSIS — M47816 Spondylosis without myelopathy or radiculopathy, lumbar region: Secondary | ICD-10-CM | POA: Diagnosis not present

## 2022-04-28 DIAGNOSIS — M5136 Other intervertebral disc degeneration, lumbar region: Secondary | ICD-10-CM

## 2022-04-28 MED ORDER — IOPAMIDOL (ISOVUE-M 200) INJECTION 41%
1.0000 mL | Freq: Once | INTRAMUSCULAR | Status: AC
  Administered 2022-04-28: 1 mL via EPIDURAL

## 2022-04-28 MED ORDER — METHYLPREDNISOLONE ACETATE 40 MG/ML INJ SUSP (RADIOLOG
80.0000 mg | Freq: Once | INTRAMUSCULAR | Status: AC
  Administered 2022-04-28: 80 mg via EPIDURAL

## 2022-04-28 NOTE — Discharge Instructions (Signed)

## 2022-05-05 NOTE — Patient Instructions (Signed)
Health Maintenance, Male Adopting a healthy lifestyle and getting preventive care are important in promoting health and wellness. Ask your health care provider about: The right schedule for you to have regular tests and exams. Things you can do on your own to prevent diseases and keep yourself healthy. What should I know about diet, weight, and exercise? Eat a healthy diet  Eat a diet that includes plenty of vegetables, fruits, low-fat dairy products, and lean protein. Do not eat a lot of foods that are high in solid fats, added sugars, or sodium. Maintain a healthy weight Body mass index (BMI) is a measurement that can be used to identify possible weight problems. It estimates body fat based on height and weight. Your health care provider can help determine your BMI and help you achieve or maintain a healthy weight. Get regular exercise Get regular exercise. This is one of the most important things you can do for your health. Most adults should: Exercise for at least 150 minutes each week. The exercise should increase your heart rate and make you sweat (moderate-intensity exercise). Do strengthening exercises at least twice a week. This is in addition to the moderate-intensity exercise. Spend less time sitting. Even light physical activity can be beneficial. Watch cholesterol and blood lipids Have your blood tested for lipids and cholesterol at 30 years of age, then have this test every 5 years. You may need to have your cholesterol levels checked more often if: Your lipid or cholesterol levels are high. You are older than 30 years of age. You are at high risk for heart disease. What should I know about cancer screening? Many types of cancers can be detected early and may often be prevented. Depending on your health history and family history, you may need to have cancer screening at various ages. This may include screening for: Colorectal cancer. Prostate cancer. Skin cancer. Lung  cancer. What should I know about heart disease, diabetes, and high blood pressure? Blood pressure and heart disease High blood pressure causes heart disease and increases the risk of stroke. This is more likely to develop in people who have high blood pressure readings or are overweight. Talk with your health care provider about your target blood pressure readings. Have your blood pressure checked: Every 3-5 years if you are 18-39 years of age. Every year if you are 40 years old or older. If you are between the ages of 65 and 75 and are a current or former smoker, ask your health care provider if you should have a one-time screening for abdominal aortic aneurysm (AAA). Diabetes Have regular diabetes screenings. This checks your fasting blood sugar level. Have the screening done: Once every three years after age 45 if you are at a normal weight and have a low risk for diabetes. More often and at a younger age if you are overweight or have a high risk for diabetes. What should I know about preventing infection? Hepatitis B If you have a higher risk for hepatitis B, you should be screened for this virus. Talk with your health care provider to find out if you are at risk for hepatitis B infection. Hepatitis C Blood testing is recommended for: Everyone born from 1945 through 1965. Anyone with known risk factors for hepatitis C. Sexually transmitted infections (STIs) You should be screened each year for STIs, including gonorrhea and chlamydia, if: You are sexually active and are younger than 30 years of age. You are older than 30 years of age and your   health care provider tells you that you are at risk for this type of infection. Your sexual activity has changed since you were last screened, and you are at increased risk for chlamydia or gonorrhea. Ask your health care provider if you are at risk. Ask your health care provider about whether you are at high risk for HIV. Your health care provider  may recommend a prescription medicine to help prevent HIV infection. If you choose to take medicine to prevent HIV, you should first get tested for HIV. You should then be tested every 3 months for as long as you are taking the medicine. Follow these instructions at home: Alcohol use Do not drink alcohol if your health care provider tells you not to drink. If you drink alcohol: Limit how much you have to 0-2 drinks a day. Know how much alcohol is in your drink. In the [place] one drink equals one 12 oz bottle of beer (355 mL), one 5 oz glass of wine (148 mL), or one 1 oz glass of hard liquor (44 mL). Lifestyle Do not use any products that contain nicotine or tobacco. These products include cigarettes, chewing tobacco, and vaping devices, such as e-cigarettes. If you need help quitting, ask your health care provider. Do not use street drugs. Do not share needles. Ask your health care provider for help if you need support or information about quitting drugs. General instructions Schedule regular health, dental, and eye exams. Stay current with your vaccines. Tell your health care provider if: You often feel depressed. You have ever been abused or do not feel safe at home. Summary Adopting a healthy lifestyle and getting preventive care are important in promoting health and wellness. Follow your health care provider's instructions about healthy diet, exercising, and getting tested or screened for diseases. Follow your health care provider's instructions on monitoring your cholesterol and blood pressure. This information is not intended to replace advice given to you by your health care provider. Make sure you discuss any questions you have with your health care provider. Document Revised: 08/27/2020 Document Reviewed: 08/27/2020 Elsevier Patient Education  2023 Elsevier Inc.  

## 2022-05-06 ENCOUNTER — Encounter: Payer: Self-pay | Admitting: Family Medicine

## 2022-05-06 ENCOUNTER — Ambulatory Visit (INDEPENDENT_AMBULATORY_CARE_PROVIDER_SITE_OTHER): Payer: BC Managed Care – PPO | Admitting: Family Medicine

## 2022-05-06 VITALS — BP 132/81 | HR 63 | Temp 97.5°F | Ht 71.0 in | Wt 170.0 lb

## 2022-05-06 DIAGNOSIS — Z0000 Encounter for general adult medical examination without abnormal findings: Secondary | ICD-10-CM | POA: Diagnosis not present

## 2022-05-06 LAB — CBC
HCT: 44.5 % (ref 39.0–52.0)
Hemoglobin: 15.3 g/dL (ref 13.0–17.0)
MCHC: 34.4 g/dL (ref 30.0–36.0)
MCV: 89.9 fl (ref 78.0–100.0)
Platelets: 238 10*3/uL (ref 150.0–400.0)
RBC: 4.94 Mil/uL (ref 4.22–5.81)
RDW: 12.3 % (ref 11.5–15.5)
WBC: 5.9 10*3/uL (ref 4.0–10.5)

## 2022-05-06 LAB — COMPREHENSIVE METABOLIC PANEL
ALT: 22 U/L (ref 0–53)
AST: 14 U/L (ref 0–37)
Albumin: 4.6 g/dL (ref 3.5–5.2)
Alkaline Phosphatase: 49 U/L (ref 39–117)
BUN: 16 mg/dL (ref 6–23)
CO2: 28 mEq/L (ref 19–32)
Calcium: 9.7 mg/dL (ref 8.4–10.5)
Chloride: 103 mEq/L (ref 96–112)
Creatinine, Ser: 1.22 mg/dL (ref 0.40–1.50)
GFR: 79.93 mL/min (ref >60.00)
Glucose, Bld: 89 mg/dL (ref 70–99)
Potassium: 3.6 mEq/L (ref 3.5–5.1)
Sodium: 141 mEq/L (ref 135–145)
Total Bilirubin: 1 mg/dL (ref 0.2–1.2)
Total Protein: 6.9 g/dL (ref 6.0–8.3)

## 2022-05-06 LAB — LIPID PANEL
Cholesterol: 199 mg/dL (ref 0–200)
HDL: 59.6 mg/dL (ref >39.00)
LDL Cholesterol: 111 mg/dL — ABNORMAL HIGH (ref 0–99)
NonHDL: 139.17
Total CHOL/HDL Ratio: 3
Triglycerides: 143 mg/dL (ref 0.0–149.0)
VLDL: 28.6 mg/dL (ref 0.0–40.0)

## 2022-05-06 LAB — TSH: TSH: 1.5 u[IU]/mL (ref 0.35–5.50)

## 2022-05-06 NOTE — Progress Notes (Signed)
Office Note 05/06/2022  CC:  Chief Complaint  Patient presents with   Annual Exam    Pt is fasting   HPI:  Patient is a 32 y.o. male who is here for annual health maintenance exam. Taking singulair daily, no significant problem with allergies lately. Albut on hand but doesn't need because no asthma symptoms lately. Truck driver now, local.  No tobacco.  He had a sinus infection back in early December and was treated with Augmentin and says this resolved appropriately.  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  MRI 11/17/16--L5/S1 DDD with herniation indenting thecal sac and bilat S1 nerve sheaths--no nerve compression.  ESI fall 2018 helpful for shortterm. Rpt 04/2017 helped x 7 mo.   Finger injury 01/2015   L long finger laceration; repaired in ED; followed up by [facility]--no apparent neurovascular injury.   Generalized anxiety disorder 04/06/2013   IBS (irritable bowel syndrome)    Mild persistent asthma    Mononucleosis    2015   Nephrolithiasis 09/2011   ED visit; left 5mm ureteral stone with slight hydroureter, right sided nonobstructing stone.   Tobacco dependence 05/09/2013    History reviewed. No pertinent surgical history.  Family History  Problem Relation Age of Onset   Hypertension Maternal Grandfather    Arthritis Maternal Grandfather     Social History   Socioeconomic History   Marital status: Single    Spouse name: Not on file   Number of children: Not on file   Years of education: Not on file   Highest education level: 12th grade  Occupational History   Not on file  Tobacco Use   Smoking status: Former    Packs/day: 0.25    Types: Cigarettes   Smokeless  tobacco: Former    Types: Nurse, children's Use: Every day  Substance and Sexual Activity   Alcohol use: Yes    Alcohol/week: 0.0 standard drinks of alcohol    Comment: Occasionally   Drug use: No   Sexual activity: Not on file  Other Topics Concern   Not on file  Social History Narrative   Single, no children.  [employer] grad.   Occupation: Neurosurgeon, local routes.   Tobacco: Smoker, quit in early 20s.   Alcohol: occ beer.   No hx of drug use/abuse.   Social Determinants of Health   Financial Resource Strain: Low Risk  (03/24/2022)   Overall Financial Resource Strain (CARDIA)    Difficulty of Paying Living Expenses: Not hard at all  Food Insecurity: No Food Insecurity (03/24/2022)   Hunger Vital Sign    Worried About Running Out of Food in the Last Year: Never true    Ran Out of Food in the Last Year: Never true  Transportation Needs: No Transportation Needs (03/24/2022)   PRAPARE - Hydrologist (Medical): No    Lack of Transportation (Non-Medical): No  Physical Activity: Sufficiently Active (03/24/2022)   Exercise Vital Sign    Days of Exercise per Week: 4 days    Minutes of Exercise per Session: 60 min  Stress: No Stress Concern  Present (03/24/2022)   [employer] of Occupational Health - Occupational Stress Questionnaire    Feeling of Stress : Only a little  Social Connections: Socially Isolated (03/24/2022)   Social Connection and Isolation Panel [NHANES]    Frequency of Communication with Friends and Family: More than three times a week    Frequency of Social Gatherings with Friends and Family: Three times a week    Attends Religious Services: Never    Active Member of Clubs or Organizations: No    Attends Engineer, structural: Not on file    Marital Status: Never married  Intimate Partner Violence: Not on file    Outpatient Medications Prior to Visit  Medication Sig Dispense Refill   montelukast  (SINGULAIR) 10 MG tablet TAKE 1 TABLET BY MOUTH AT BEDTIME. NEEDS OFFICE VISIT FOR MORE REFILLS. 30 tablet 0   albuterol (VENTOLIN HFA) 108 (90 Base) MCG/ACT inhaler TAKE 2 PUFFS BY MOUTH EVERY 6 HOURS AS NEEDED FOR WHEEZE OR SHORTNESS OF BREATH (Patient not taking: Reported on 05/06/2022) 8.5 each 0   ipratropium (ATROVENT) 0.06 % nasal spray Place 2 sprays into both nostrils 4 (four) times daily. (Patient not taking: Reported on 05/06/2022) 15 mL 0   amoxicillin-clavulanate (AUGMENTIN) 875-125 MG tablet Take 1 tablet by mouth 2 (two) times daily. 20 tablet 0   predniSONE (DELTASONE) 50 MG tablet One tab PO daily for 5 days. (Patient not taking: Reported on 05/06/2022) 5 tablet 0   No facility-administered medications prior to visit.    No Known Allergies  Review of Systems  Constitutional:  Negative for appetite change, chills, fatigue and fever.  HENT:  Negative for congestion, dental problem, ear pain and sore throat.   Eyes:  Negative for discharge, redness and visual disturbance.  Respiratory:  Negative for cough, chest tightness, shortness of breath and wheezing.   Cardiovascular:  Negative for chest pain, palpitations and leg swelling.  Gastrointestinal:  Negative for abdominal pain, blood in stool, diarrhea, nausea and vomiting.  Genitourinary:  Negative for difficulty urinating, dysuria, flank pain, frequency, hematuria and urgency.  Musculoskeletal:  Negative for arthralgias, back pain, joint swelling, myalgias and neck stiffness.  Skin:  Negative for pallor and rash.  Neurological:  Negative for dizziness, speech difficulty, weakness and headaches.  Hematological:  Negative for adenopathy. Does not bruise/bleed easily.  Psychiatric/Behavioral:  Negative for confusion and sleep disturbance. The patient is not nervous/anxious.     PE;    05/06/2022    8:57 AM 04/28/2022   11:31 AM 03/25/2022    1:22 PM  Vitals with BMI  Height 5\' 11"   5\' 11"   Weight 170 lbs  172 lbs  BMI 23.72   24  Systolic 132 132  Diastolic 81 95 76  Pulse 63 73 82   Gen: Alert, well appearing.  Patient is oriented to person, place, time, and situation. AFFECT: pleasant, lucid thought and speech. ENT: Ears: EACs clear, normal epithelium.  TMs with good light reflex and landmarks bilaterally.  Eyes: no injection, icteris, swelling, or exudate.  EOMI, PERRLA. Nose: no drainage or turbinate edema/swelling.  No injection or focal lesion.  Mouth: lips without lesion/swelling.  Oral mucosa pink and moist.  Dentition intact and without obvious caries or gingival swelling.  Oropharynx without erythema, exudate, or swelling.  Neck: supple/nontender.  No LAD, mass, or TM.  Carotid pulses 2+ bilaterally, without bruits. CV: RRR, no m/r/g.   LUNGS: CTA bilat, nonlabored resps, good aeration in all lung fields. ABD:  soft, NT, ND, BS normal.  No hepatospenomegaly or mass.  No bruits. EXT: no clubbing, cyanosis, or edema.  Musculoskeletal: no joint swelling, erythema, warmth, or tenderness.  ROM of all joints intact. Skin - no sores or suspicious lesions or rashes or color changes  Pertinent labs:  Lab Results  Component Value Date   TSH 0.98 04/28/2017   Lab Results  Component Value Date   WBC 5.0 06/14/2018   HGB 15.6 06/14/2018   HCT 44.8 06/14/2018   MCV 89.5 06/14/2018   PLT 193.0 06/14/2018   Lab Results  Component Value Date   CREATININE 1.35 06/14/2018   BUN 17 06/14/2018   NA 140 06/14/2018   K 4.2 06/14/2018   CL 105 06/14/2018   CO2 27 06/14/2018   Lab Results  Component Value Date   ALT 17 06/14/2018   AST 17 06/14/2018   ALKPHOS 45 06/14/2018   BILITOT 1.1 06/14/2018   ASSESSMENT AND PLAN:   No problem-specific Assessment & Plan notes found for this encounter.  Health maintenance exam: Reviewed age and gender appropriate health maintenance issues (prudent diet, regular exercise, health risks of tobacco and excessive alcohol, use of seatbelts, fire alarms in home, use  of sunscreen).  Also reviewed age and gender appropriate health screening as well as vaccine recommendations. Vaccines: Up to date Labs: fasting HP today.  An After Visit Summary was printed and given to the patient.  FOLLOW UP:  Return in about 1 year (around 05/07/2023) for annual CPE (fasting).  Signed:  Crissie Sickles, MD           05/06/2022

## 2022-05-13 ENCOUNTER — Ambulatory Visit: Payer: BC Managed Care – PPO | Admitting: Sports Medicine

## 2022-05-13 DIAGNOSIS — M5136 Other intervertebral disc degeneration, lumbar region with discogenic back pain only: Secondary | ICD-10-CM

## 2022-05-13 NOTE — Assessment & Plan Note (Signed)
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 He still has some discomfort as expected but not bad enough to consider another epidural, he will do half tab of meloxicam daily as needed, he will continue his aggressive core conditioning, and return to see me as needed.

## 2022-05-13 NOTE — Progress Notes (Signed)
    Procedures performed today:    None.  Independent interpretation of notes and tests performed by another provider:   None.  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 He still has some discomfort as expected but not bad enough to consider another epidural, he will do half tab of meloxicam daily as needed, he will continue his aggressive core conditioning, and return to see me as needed.    ____________________________________________ Gwen Her. Dianah Field, M.D., ABFM., CAQSM., AME. Primary Care and Sports Medicine [place] [place] [facility]  Adjunct Professor of [employer] [facility] [employer]  Risk manager

## 2022-05-14 ENCOUNTER — Other Ambulatory Visit: Payer: Self-pay | Admitting: Family Medicine

## 2022-05-19 ENCOUNTER — Other Ambulatory Visit: Payer: Self-pay | Admitting: Family Medicine

## 2022-07-10 DIAGNOSIS — S0502XA Injury of conjunctiva and corneal abrasion without foreign body, left eye, initial encounter: Secondary | ICD-10-CM | POA: Diagnosis not present

## 2022-08-17 ENCOUNTER — Encounter (INDEPENDENT_AMBULATORY_CARE_PROVIDER_SITE_OTHER): Payer: BC Managed Care – PPO | Admitting: Sports Medicine

## 2022-08-17 DIAGNOSIS — M5136 Other intervertebral disc degeneration, lumbar region with discogenic back pain only: Secondary | ICD-10-CM

## 2022-08-18 NOTE — Telephone Encounter (Signed)
I spent 5 total minutes of online digital evaluation and management services in this patient-initiated request for online care. 

## 2022-08-19 MED ORDER — PREDNISONE 50 MG PO TABS: ORAL_TABLET | ORAL | 0 refills | Status: DC

## 2022-08-19 NOTE — Addendum Note (Signed)
Addended by: Monica Becton on: 08/19/2022 02:16 PM   Modules accepted: Orders

## 2022-08-21 ENCOUNTER — Encounter: Payer: Self-pay | Admitting: Sports Medicine

## 2022-08-24 ENCOUNTER — Ambulatory Visit (INDEPENDENT_AMBULATORY_CARE_PROVIDER_SITE_OTHER): Payer: BC Managed Care – PPO

## 2022-08-24 DIAGNOSIS — M48061 Spinal stenosis, lumbar region without neurogenic claudication: Secondary | ICD-10-CM | POA: Diagnosis not present

## 2022-08-24 DIAGNOSIS — M5136 Other intervertebral disc degeneration, lumbar region: Secondary | ICD-10-CM | POA: Diagnosis not present

## 2022-08-28 NOTE — Addendum Note (Signed)
Addended by: Monica Becton on: 08/28/2022 11:54 AM   Modules accepted: Orders

## 2022-09-18 ENCOUNTER — Ambulatory Visit
Admission: RE | Admit: 2022-09-18 | Discharge: 2022-09-18 | Disposition: A | Discharge status: Home / Self Care | Payer: BC Managed Care – PPO | Source: Ambulatory Visit | Attending: Sports Medicine | Admitting: Sports Medicine

## 2022-09-18 DIAGNOSIS — M4727 Other spondylosis with radiculopathy, lumbosacral region: Secondary | ICD-10-CM | POA: Diagnosis not present

## 2022-09-18 DIAGNOSIS — M5136 Other intervertebral disc degeneration, lumbar region: Secondary | ICD-10-CM

## 2022-09-18 MED ORDER — METHYLPREDNISOLONE ACETATE 40 MG/ML INJ SUSP (RADIOLOG
80.0000 mg | Freq: Once | INTRAMUSCULAR | Status: AC
  Administered 2022-09-18: 80 mg via EPIDURAL

## 2022-09-18 MED ORDER — IOPAMIDOL (ISOVUE-M 200) INJECTION 41%
1.0000 mL | Freq: Once | INTRAMUSCULAR | Status: AC
  Administered 2022-09-18: 1 mL via EPIDURAL

## 2022-09-18 NOTE — Discharge Instructions (Signed)

## 2022-09-25 ENCOUNTER — Encounter: Payer: Self-pay | Admitting: Family Medicine

## 2022-09-25 ENCOUNTER — Ambulatory Visit: Payer: BC Managed Care – PPO | Admitting: Family Medicine

## 2022-09-25 VITALS — BP 122/70 | HR 77 | Temp 99.1°F | Ht 71.0 in | Wt 184.4 lb

## 2022-09-25 DIAGNOSIS — R109 Unspecified abdominal pain: Secondary | ICD-10-CM | POA: Diagnosis not present

## 2022-09-25 DIAGNOSIS — K8020 Calculus of gallbladder without cholecystitis without obstruction: Secondary | ICD-10-CM | POA: Diagnosis not present

## 2022-09-25 DIAGNOSIS — Z87442 Personal history of urinary calculi: Secondary | ICD-10-CM

## 2022-09-25 LAB — POCT URINALYSIS DIP (MANUAL ENTRY)
Bilirubin, UA: NEGATIVE
Blood, UA: NEGATIVE
Glucose, UA: NEGATIVE mg/dL
Ketones, POC UA: NEGATIVE mg/dL
Leukocytes, UA: NEGATIVE
Nitrite, UA: NEGATIVE
Protein Ur, POC: NEGATIVE mg/dL
Spec Grav, UA: 1.015 (ref 1.010–1.025)
Urobilinogen, UA: 0.2 E.U./dL
pH, UA: 6.5 (ref 5.0–8.0)

## 2022-09-25 LAB — CBC
HCT: 51.5 % (ref 39.0–52.0)
Hemoglobin: 17 g/dL (ref 13.0–17.0)
MCHC: 33 g/dL (ref 30.0–36.0)
MCV: 94.3 fl (ref 78.0–100.0)
Platelets: 227 10*3/uL (ref 150.0–400.0)
RBC: 5.47 Mil/uL (ref 4.22–5.81)
RDW: 13.3 % (ref 11.5–15.5)
WBC: 8.1 10*3/uL (ref 4.0–10.5)

## 2022-09-25 LAB — COMPREHENSIVE METABOLIC PANEL
ALT: 25 U/L (ref 0–53)
AST: 24 U/L (ref 0–37)
Albumin: 4.4 g/dL (ref 3.5–5.2)
Alkaline Phosphatase: 45 U/L (ref 39–117)
BUN: 15 mg/dL (ref 6–23)
CO2: 29 mEq/L (ref 19–32)
Calcium: 9.2 mg/dL (ref 8.4–10.5)
Chloride: 102 mEq/L (ref 96–112)
Creatinine, Ser: 1.43 mg/dL (ref 0.40–1.50)
GFR: 65.88 mL/min (ref >60.00)
Glucose, Bld: 81 mg/dL (ref 70–99)
Potassium: 4.2 mEq/L (ref 3.5–5.1)
Sodium: 140 mEq/L (ref 135–145)
Total Bilirubin: 1.1 mg/dL (ref 0.2–1.2)
Total Protein: 7.2 g/dL (ref 6.0–8.3)

## 2022-09-25 NOTE — Progress Notes (Signed)
Subjective:  Patient ID: Brian Mills., male    DOB: [birth year]  Age: 32 y.o. MRN: [id number]  CC:  Chief Complaint  Patient presents with   Abdominal Pain    Pt notes pain in the Rt side of his abdomen starting about 1 week ago denied pain in the back, pain comes and goes notes fast movents and bending recreate the pain the most     HPI Brian Mills. presents for   Right-sided abdominal pain: Started about a week ago. Sudden pain to right of umbilicus. No lifting/straining at that time, but has noticed pain recur with quick movement or bending forward.  No n/v. No change with eating. Gallstones noted on lumbar MRI 5/5.  No abdominal surgeries.  No fever.  No diarrhea/constipation/straining. No melena/hematochezia.  BM daily., soft stools.  Workouts at gym 4 days per week, no injury or exercise that caused pain.  No rash/skin changes.  Intermittent, overall about the same. With movement typically or lying on stomach. Few times per day.  Eating and drinking normally.  Hx of nephrolithiasis years ago. No recent flare. No hematuria/urgency/dysuria. No testicle pain, groin pain, penile rash or d/c.  Tx: none.   History Patient Active Problem List   Diagnosis Date Noted   Pes cavus 11/12/2016   Discogenic low back pain 10/09/2016   Winged scapula of right side 06/22/2015   Tobacco dependence 05/09/2013   Past Medical History:  Diagnosis Date   Acne vulgaris 11/16/2012   Adapalene -benz perox helped clear this up    Allergic rhinitis    Cervical radiculopathy at C6 02/2014   Left C6-C7 (NCS and EMG at [facility] for left arm paresthesias)   Cholelithiasis    asymptomatic   Discogenic low back pain 09/2016   Sports Med MD.  MRI 11/17/16--L5/S1 DDD with herniation indenting thecal sac and bilat S1 nerve sheaths--no nerve compression.  ESI fall 2018 helpful for shortterm. Rpt 04/2017 helped x 7 mo.   Finger injury 01/2015   L long finger laceration; repaired in ED;  followed up by [facility]--no apparent neurovascular injury.   Generalized anxiety disorder 04/06/2013   IBS (irritable bowel syndrome)    Mild persistent asthma    Mononucleosis    2015   Nephrolithiasis 09/2011   ED visit; left 3mm ureteral stone with slight hydroureter, right sided nonobstructing stone.   Tobacco dependence 05/09/2013   No past surgical history on file. No Known Allergies Prior to Admission medications   Medication Sig Start Date End Date Taking? Authorizing Provider  albuterol (VENTOLIN HFA) 108 (90 Base) MCG/ACT inhaler TAKE 2 PUFFS BY MOUTH EVERY 6 HOURS AS NEEDED FOR WHEEZE OR SHORTNESS OF BREATH 04/22/22  Yes McGowen, Maryjean Morn, MD  ipratropium (ATROVENT) 0.06 % nasal spray Place 2 sprays into both nostrils 4 (four) times daily. 03/25/22  Yes Kuneff, Renee A, DO  montelukast (SINGULAIR) 10 MG tablet TAKE 1 TABLET BY MOUTH AT BEDTIME. NEEDS OFFICE VISIT FOR MORE REFILLS. 05/14/22  Yes McGowen, Maryjean Morn, MD  predniSONE (DELTASONE) 50 MG tablet One tab PO daily for 5 days. 08/19/22  Yes Monica Becton, MD   Social History   Socioeconomic History   Marital status: Single    Spouse name: Not on file   Number of children: Not on file   Years of education: Not on file   Highest education level: 12th grade  Occupational History   Not on file  Tobacco Use   Smoking status:  Former    Packs/day: .25    Types: Cigarettes   Smokeless tobacco: Former    Types: Associate Professor Use: Every day  Substance and Sexual Activity   Alcohol use: Yes    Alcohol/week: 0.0 standard drinks of alcohol    Comment: Occasionally   Drug use: No   Sexual activity: Not on file  Other Topics Concern   Not on file  Social History Narrative   Single, no children.  [employer] grad.   Occupation: Magazine features editor, local routes.   Tobacco: Smoker, quit in early 20s.   Alcohol: occ beer.   No hx of drug use/abuse.   Social Determinants of Health   Financial  Resource Strain: Low Risk  (03/24/2022)   Overall Financial Resource Strain (CARDIA)    Difficulty of Paying Living Expenses: Not hard at all  Food Insecurity: No Food Insecurity (03/24/2022)   Hunger Vital Sign    Worried About Running Out of Food in the Last Year: Never true    Ran Out of Food in the Last Year: Never true  Transportation Needs: No Transportation Needs (03/24/2022)   PRAPARE - Administrator, Civil Service (Medical): No    Lack of Transportation (Non-Medical): No  Physical Activity: Sufficiently Active (03/24/2022)   Exercise Vital Sign    Days of Exercise per Week: 4 days    Minutes of Exercise per Session: 60 min  Stress: No Stress Concern Present (03/24/2022)   [employer] of Occupational Health - Occupational Stress Questionnaire    Feeling of Stress : Only a little  Social Connections: Socially Isolated (03/24/2022)   Social Connection and Isolation Panel [NHANES]    Frequency of Communication with Friends and Family: More than three times a week    Frequency of Social Gatherings with Friends and Family: Three times a week    Attends Religious Services: Never    Active Member of Clubs or Organizations: No    Attends Engineer, structural: Not on file    Marital Status: Never married  Intimate Partner Violence: Not on file    Review of Systems  Per HPI.  Objective:   Vitals:   09/25/22 1114  BP: 122/70  Pulse: 77  Temp: 99.1 F (37.3 C)  TempSrc: Temporal  SpO2: 97%  Weight: 184 lb 6.4 oz (83.6 kg)  Height: 5\' 11"  (1.803 m)     Physical Exam Vitals reviewed.  Constitutional:      Appearance: He is well-developed.  HENT:     Head: Normocephalic and atraumatic.  Neck:     Vascular: No carotid bruit or JVD.  Cardiovascular:     Rate and Rhythm: Normal rate and regular rhythm.     Heart sounds: Normal heart sounds. No murmur heard. Pulmonary:     Effort: Pulmonary effort is normal.     Breath sounds: Normal breath  sounds. No rales.  Abdominal:     Tenderness: There is no abdominal tenderness. There is no right CVA tenderness or left CVA tenderness. Negative signs include Murphy's sign and McBurney's sign.  Musculoskeletal:     Right lower leg: No edema.     Left lower leg: No edema.  Skin:    General: Skin is warm and dry.  Neurological:     Mental Status: He is alert and oriented to person, place, and time.  Psychiatric:        Mood and Affect: Mood normal.  Assessment & Plan:  Eartha Koehn. is a 31 y.o. male . Right sided abdominal pain - Plan: CBC, Comprehensive metabolic panel, POCT urinalysis dipstick  History of nephrolithiasis - Plan: POCT urinalysis dipstick  Calculus of gallbladder without cholecystitis without obstruction - Plan: Comprehensive metabolic panel Right-sided abdominal pain, intermittent, primarily with movement.  No fever, bowel changes or vomiting and has been eating without difficulty.  Unlikely appendicitis.  History of cholelithiasis on recent MRI but less likely cholecystitis with negative Eulah Pont and reassuring exam.  History of nephrolithiasis but less likely given current presentation and asymptomatic presently.  Check urinalysis, CBC, CMP, and then consider ultrasound versus other imaging depending on symptoms.  Differential includes abdominal wall pain/strain.  Relative rest with avoidance of heavy lifting core exercises temporarily, ER/RTC precautions.  No orders of the defined types were placed in this encounter.  Patient Instructions   Exam is reassuring today, unlikely acute gallbladder attack or appendicitis but will check some blood work.  Will also check a urine test with history of kidney stones but that would be less likely as well.  Depending on results of blood work can then decide on either ultrasound or CT scan.  Could still be abdominal wall pain. For now avoid core exercises, and heavy lifting for the next week and if that pain is not  improving, follow-up with myself or primary care provider to decide on next steps.   Return to the clinic or go to the nearest emergency room if any of your symptoms worsen or new symptoms occur.  Abdominal Pain, Adult  Pain in the abdomen (abdominal pain) can be caused by many things. In most cases, it gets better with no treatment or by being treated at home. But in some cases, it can be serious. Your health care provider will ask questions about your medical history and do a physical exam to try to figure out what is causing your pain. Follow these instructions at home: Medicines Take over-the-counter and prescription medicines only as told by your provider. Do not take medicines that help you poop (laxatives) unless told by your provider. General instructions Watch your condition for any changes. Drink enough fluid to keep your pee (urine) pale yellow. Contact a health care provider if: Your pain changes, gets worse, or lasts longer than expected. You have severe cramping or bloating in your abdomen, or you vomit. Your pain gets worse with meals, after eating, or with certain foods. You are constipated or have diarrhea for more than 2-3 days. You are not hungry, or you lose weight without trying. You have signs of dehydration. These may include: Dark pee, very little pee, or no pee. Cracked lips or dry mouth. Sleepiness or weakness. You have pain when you pee (urinate) or poop. Your abdominal pain wakes you up at night. You have blood in your pee. You have a fever. Get help right away if: You cannot stop vomiting. Your pain is only in one part of the abdomen. Pain on the right side could be caused by appendicitis. You have bloody or black poop (stool), or poop that looks like tar. You have trouble breathing. You have chest pain. These symptoms may be an emergency. Get help right away. Call 911. Do not wait to see if the symptoms will go away. Do not drive yourself to the  hospital. This information is not intended to replace advice given to you by your health care provider. Make sure you discuss any questions you have with your  health care provider. Document Revised: 01/22/2022 Document Reviewed: 01/22/2022 Elsevier Patient Education  2024 Elsevier Inc.       Signed,   Meredith Staggers, MD [facility], [facility] 09/25/22 6:09 PM

## 2022-09-25 NOTE — Patient Instructions (Signed)
  Exam is reassuring today, unlikely acute gallbladder attack or appendicitis but will check some blood work.  Will also check a urine test with history of kidney stones but that would be less likely as well.  Depending on results of blood work can then decide on either ultrasound or CT scan.  Could still be abdominal wall pain. For now avoid core exercises, and heavy lifting for the next week and if that pain is not improving, follow-up with myself or primary care provider to decide on next steps.   Return to the clinic or go to the nearest emergency room if any of your symptoms worsen or new symptoms occur.  Abdominal Pain, Adult  Pain in the abdomen (abdominal pain) can be caused by many things. In most cases, it gets better with no treatment or by being treated at home. But in some cases, it can be serious. Your health care provider will ask questions about your medical history and do a physical exam to try to figure out what is causing your pain. Follow these instructions at home: Medicines Take over-the-counter and prescription medicines only as told by your provider. Do not take medicines that help you poop (laxatives) unless told by your provider. General instructions Watch your condition for any changes. Drink enough fluid to keep your pee (urine) pale yellow. Contact a health care provider if: Your pain changes, gets worse, or lasts longer than expected. You have severe cramping or bloating in your abdomen, or you vomit. Your pain gets worse with meals, after eating, or with certain foods. You are constipated or have diarrhea for more than 2-3 days. You are not hungry, or you lose weight without trying. You have signs of dehydration. These may include: Dark pee, very little pee, or no pee. Cracked lips or dry mouth. Sleepiness or weakness. You have pain when you pee (urinate) or poop. Your abdominal pain wakes you up at night. You have blood in your pee. You have a fever. Get  help right away if: You cannot stop vomiting. Your pain is only in one part of the abdomen. Pain on the right side could be caused by appendicitis. You have bloody or black poop (stool), or poop that looks like tar. You have trouble breathing. You have chest pain. These symptoms may be an emergency. Get help right away. Call 911. Do not wait to see if the symptoms will go away. Do not drive yourself to the hospital. This information is not intended to replace advice given to you by your health care provider. Make sure you discuss any questions you have with your health care provider. Document Revised: 01/22/2022 Document Reviewed: 01/22/2022 Elsevier Patient Education  2024 [employer].

## 2022-10-05 ENCOUNTER — Encounter: Payer: Self-pay | Admitting: Family Medicine

## 2022-10-05 DIAGNOSIS — R1033 Periumbilical pain: Secondary | ICD-10-CM

## 2022-10-06 NOTE — Telephone Encounter (Signed)
AVS states: " Depending on results of blood work can then decide on either ultrasound or CT scan.  Could still be abdominal wall pain. For now avoid core exercises, and heavy lifting for the next week and if that pain is not improving, follow-up with myself or primary care provider to decide on next steps. "  Please advise next steps given continued pain

## 2022-10-28 ENCOUNTER — Ambulatory Visit
Admission: RE | Admit: 2022-10-28 | Discharge: 2022-10-28 | Disposition: A | Discharge status: Home / Self Care | Payer: BC Managed Care – PPO | Source: Ambulatory Visit | Attending: Family Medicine | Admitting: Family Medicine

## 2022-10-28 DIAGNOSIS — R1031 Right lower quadrant pain: Secondary | ICD-10-CM | POA: Diagnosis not present

## 2022-10-28 DIAGNOSIS — N200 Calculus of kidney: Secondary | ICD-10-CM | POA: Diagnosis not present

## 2022-10-28 DIAGNOSIS — R1033 Periumbilical pain: Secondary | ICD-10-CM

## 2022-10-28 DIAGNOSIS — K8020 Calculus of gallbladder without cholecystitis without obstruction: Secondary | ICD-10-CM | POA: Diagnosis not present

## 2022-10-28 MED ORDER — IOPAMIDOL (ISOVUE-300) INJECTION 61%
100.0000 mL | Freq: Once | INTRAVENOUS | Status: AC | PRN
  Administered 2022-10-28: 100 mL via INTRAVENOUS

## 2023-04-27 ENCOUNTER — Encounter: Payer: Self-pay | Admitting: Family Medicine

## 2023-04-27 ENCOUNTER — Ambulatory Visit: Payer: BC Managed Care – PPO | Admitting: Family Medicine

## 2023-04-27 VITALS — BP 110/82 | HR 83 | Temp 98.1°F | Wt 178.6 lb

## 2023-04-27 DIAGNOSIS — K8020 Calculus of gallbladder without cholecystitis without obstruction: Secondary | ICD-10-CM

## 2023-04-27 DIAGNOSIS — R1011 Right upper quadrant pain: Secondary | ICD-10-CM | POA: Diagnosis not present

## 2023-04-27 MED ORDER — ONDANSETRON 4 MG PO TBDP
4.0000 mg | ORAL_TABLET | Freq: Three times a day (TID) | ORAL | 0 refills | Status: AC | PRN
Start: 2023-04-27 — End: ?

## 2023-04-27 NOTE — Patient Instructions (Signed)

## 2023-04-27 NOTE — Progress Notes (Signed)
 Brian Rosina Raddle. , 07-03-1990, 33 y.o., male MRN: [id number] Patient Care Team    Relationship Specialty Notifications Start End  McGowen, Aleene DEL, MD PCP - General Family Medicine  01/19/12   Brian Onetha NOVAK, MD Consulting Physician Neurology  03/08/14   Curtis Debby PARAS, MD Consulting Physician Sports Medicine  06/27/15     Chief Complaint  Patient presents with   Abdominal Pain    Few months ago; RUQ     Subjective: Brian Rathbone. is a 33 y.o. Pt presents for an OV with complaints of right upper abdominal pain of 4 months duration. He has a pmh of gallstones and kidney stones.  He was evaluated by Dr. Levora in June 2024 for pain and found to have gallstones and kidney stones. Labs were normal . Patient does not have history of abdominal surgery Patient denies fever, chills or vomiting.  Patient endorses nausea and more frequent RUQ pain, sometimes twice a day now, but sometimes skips a day or two of the week.  CT 10/28/2022: Small layering gallstones within the gallbladder. No biliary ductal dilatation or focal hepatic abnormality. Adrenals/Urinary Tract: Few small bilateral punctate renal stones, the largest a 3 mm nonobstructing stone in the lower pole of the left kidney. No hydronephrosis. Adrenal glands and urinary bladder unremarkable. Stomach/Bowel: Normal appendix. Stomach, large and small bowel grossly unremarkable.   He is currently not in pain and tolerating PO.      09/25/2022   11:13 AM 03/25/2022    1:21 PM 06/14/2018    8:38 AM 06/02/2017    1:09 PM  Depression screen PHQ 2/9  Decreased Interest 0 0 0 0  Down, Depressed, Hopeless 0 0 0 0  PHQ - 2 Score 0 0 0 0  Altered sleeping 0     Tired, decreased energy 0     Change in appetite 0     Feeling bad or failure about yourself  0     Trouble concentrating 0     Moving slowly or fidgety/restless 0     Suicidal thoughts 0     PHQ-9 Score 0       No Known Allergies Social History    Social History Narrative   Single, no children.  [employer] grad.   Occupation: Magazine features editor, local routes.   Tobacco: Smoker, quit in early 20s.   Alcohol: occ beer.   No hx of drug use/abuse.   Past Medical History:  Diagnosis Date   Acne vulgaris 11/16/2012   Adapalene  -benz perox helped clear this up    Allergic rhinitis    Cervical radiculopathy at C6 02/2014   Left C6-C7 (NCS and EMG at [facility] for left arm paresthesias)   Cholelithiasis    asymptomatic   Discogenic low back pain 09/2016   Sports Med MD.  MRI 11/17/16--L5/S1 DDD with herniation indenting thecal sac and bilat S1 nerve sheaths--no nerve compression.  ESI fall 2018 helpful for shortterm. Rpt 04/2017 helped x 7 mo.   Finger injury 01/2015   L long finger laceration; repaired in ED; followed up by [facility]--no apparent neurovascular injury.   Generalized anxiety disorder 04/06/2013   IBS (irritable bowel syndrome)    Mild persistent asthma    Mononucleosis    2015   Nephrolithiasis 09/2011   ED visit; left 3mm ureteral stone with slight hydroureter, right sided nonobstructing stone.   Tobacco dependence 05/09/2013   History reviewed. No pertinent surgical history.  Family History  Problem Relation Age of Onset   Hypertension Maternal Grandfather    Arthritis Maternal Grandfather    Allergies as of 04/27/2023   No Known Allergies      Medication List        Accurate as of April 27, 2023 10:26 AM. If you have any questions, ask your nurse or doctor.          STOP taking these medications    predniSONE  50 MG tablet Commonly known as: DELTASONE  Stopped by: Charlies Bellini       TAKE these medications    albuterol  108 (90 Base) MCG/ACT inhaler Commonly known as: VENTOLIN  HFA TAKE 2 PUFFS BY MOUTH EVERY 6 HOURS AS NEEDED FOR WHEEZE OR SHORTNESS OF BREATH   ipratropium 0.06 % nasal spray Commonly known as: ATROVENT  Place 2 sprays into both nostrils 4 (four) times daily.    montelukast  10 MG tablet Commonly known as: SINGULAIR  TAKE 1 TABLET BY MOUTH AT BEDTIME. NEEDS OFFICE VISIT FOR MORE REFILLS.   ondansetron  4 MG disintegrating tablet Commonly known as: ZOFRAN -ODT Take 1 tablet (4 mg total) by mouth every 8 (eight) hours as needed for nausea or vomiting. Started by: Charlies Bellini        All past medical history, surgical history, allergies, family history, immunizations andmedications were updated in the EMR today and reviewed under the history and medication portions of their EMR.     ROS Negative, with the exception of above mentioned in HPI   Objective:  BP 110/82   Pulse 83   Temp 98.1 F (36.7 C)   Wt 178 lb 9.6 oz (81 kg)   SpO2 97%   BMI 24.91 kg/m  Body mass index is 24.91 kg/m. Physical Exam Vitals and nursing note reviewed.  Constitutional:      General: He is not in acute distress.    Appearance: Normal appearance. He is not ill-appearing, toxic-appearing or diaphoretic.  HENT:     Head: Normocephalic and atraumatic.  Eyes:     General: No scleral icterus.       Right eye: No discharge.        Left eye: No discharge.     Extraocular Movements: Extraocular movements intact.     Pupils: Pupils are equal, round, and reactive to light.  Cardiovascular:     Rate and Rhythm: Normal rate and regular rhythm.  Pulmonary:     Effort: Pulmonary effort is normal.     Breath sounds: Normal breath sounds.  Abdominal:     General: Abdomen is flat. Bowel sounds are normal. There is no distension.     Palpations: Abdomen is soft.     Tenderness: There is no abdominal tenderness. There is no right CVA tenderness, left CVA tenderness, guarding or rebound.     Comments: Negative murphy  Skin:    General: Skin is warm and dry.     Coloration: Skin is not jaundiced or pale.     Findings: No rash.  Neurological:     Mental Status: He is alert and oriented to person, place, and time. Mental status is at baseline.  Psychiatric:         Mood and Affect: Mood normal.        Behavior: Behavior normal.        Thought Content: Thought content normal.        Judgment: Judgment normal.     No results found. No results found. No results found for this or  any previous visit (from the past 24 hours).  Assessment/Plan: Brian Schnitzer. is a 33 y.o. male present for OV for  Right upper quadrant abdominal pain (Primary)/Calculus of gallbladder without cholecystitis without obstruction Discussed surgical intervention is recommended for his recurrent RUQ pain d/t gallstones. He reports he is tired of the reoccurring symptoms and would like referral to discuss with surgeon.  He is not currently in pain, therefore labs/image not warranted.  Zofran  prescribed for nausea, if needed.  - Ambulatory referral to General Surgery  Reviewed expectations re: course of current medical issues. Discussed self-management of symptoms. Outlined signs and symptoms indicating need for more acute intervention. Patient verbalized understanding and all questions were answered. Patient received an After-Visit Summary.    Orders Placed This Encounter  Procedures   Ambulatory referral to General Surgery   Meds ordered this encounter  Medications   ondansetron  (ZOFRAN -ODT) 4 MG disintegrating tablet    Sig: Take 1 tablet (4 mg total) by mouth every 8 (eight) hours as needed for nausea or vomiting.    Dispense:  20 tablet    Refill:  0   Referral Orders         Ambulatory referral to General Surgery       Note is dictated utilizing voice recognition software. Although note has been proof read prior to signing, occasional typographical errors still can be missed. If any questions arise, please do not hesitate to call for verification.   electronically signed by:  Charlies Bellini, DO  [facility] [place]

## 2023-05-08 ENCOUNTER — Encounter: Payer: BC Managed Care – PPO | Admitting: Family Medicine

## 2023-05-18 ENCOUNTER — Other Ambulatory Visit: Payer: Self-pay | Admitting: Surgery

## 2023-05-18 DIAGNOSIS — K8020 Calculus of gallbladder without cholecystitis without obstruction: Secondary | ICD-10-CM | POA: Diagnosis not present

## 2023-05-26 DIAGNOSIS — K8020 Calculus of gallbladder without cholecystitis without obstruction: Secondary | ICD-10-CM | POA: Diagnosis not present

## 2023-05-26 DIAGNOSIS — K8010 Calculus of gallbladder with chronic cholecystitis without obstruction: Secondary | ICD-10-CM | POA: Diagnosis not present

## 2023-12-22 ENCOUNTER — Encounter: Payer: Self-pay | Admitting: Sports Medicine

## 2024-02-02 ENCOUNTER — Ambulatory Visit: Admitting: Family Medicine

## 2024-02-02 ENCOUNTER — Encounter: Payer: Self-pay | Admitting: Family Medicine

## 2024-02-02 VITALS — BP 127/78 | HR 65 | Temp 97.4°F | Ht 71.25 in | Wt 167.0 lb

## 2024-02-02 DIAGNOSIS — R140 Abdominal distension (gaseous): Secondary | ICD-10-CM

## 2024-02-02 DIAGNOSIS — F411 Generalized anxiety disorder: Secondary | ICD-10-CM | POA: Diagnosis not present

## 2024-02-02 DIAGNOSIS — R109 Unspecified abdominal pain: Secondary | ICD-10-CM

## 2024-02-02 DIAGNOSIS — K529 Noninfective gastroenteritis and colitis, unspecified: Secondary | ICD-10-CM | POA: Diagnosis not present

## 2024-02-02 LAB — COMPREHENSIVE METABOLIC PANEL WITH GFR
ALT: 36 U/L (ref 0–53)
AST: 21 U/L (ref 0–37)
Albumin: 5 g/dL (ref 3.5–5.2)
Alkaline Phosphatase: 46 U/L (ref 39–117)
BUN: 17 mg/dL (ref 6–23)
CO2: 30 meq/L (ref 19–32)
Calcium: 9.8 mg/dL (ref 8.4–10.5)
Chloride: 102 meq/L (ref 96–112)
Creatinine, Ser: 1.27 mg/dL (ref 0.40–1.50)
GFR: 75.24 mL/min (ref >60.00)
Glucose, Bld: 105 mg/dL — ABNORMAL HIGH (ref 70–99)
Potassium: 4.8 meq/L (ref 3.5–5.1)
Sodium: 140 meq/L (ref 135–145)
Total Bilirubin: 1 mg/dL (ref 0.2–1.2)
Total Protein: 7.2 g/dL (ref 6.0–8.3)

## 2024-02-02 LAB — CBC WITH DIFFERENTIAL/PLATELET
Basophils Absolute: 0.1 K/uL (ref 0.0–0.1)
Basophils Relative: 1.9 % (ref 0.0–3.0)
Eosinophils Absolute: 0.7 K/uL (ref 0.0–0.7)
Eosinophils Relative: 11.8 % — ABNORMAL HIGH (ref 0.0–5.0)
HCT: 48.9 % (ref 39.0–52.0)
Hemoglobin: 16.5 g/dL (ref 13.0–17.0)
Lymphocytes Relative: 22.1 % (ref 12.0–46.0)
Lymphs Abs: 1.3 K/uL (ref 0.7–4.0)
MCHC: 33.8 g/dL (ref 30.0–36.0)
MCV: 92.4 fl (ref 78.0–100.0)
Monocytes Absolute: 0.6 K/uL (ref 0.1–1.0)
Monocytes Relative: 10.9 % (ref 3.0–12.0)
Neutro Abs: 3.1 K/uL (ref 1.4–7.7)
Neutrophils Relative %: 53.3 % (ref 43.0–77.0)
Platelets: 187 K/uL (ref 150.0–400.0)
RBC: 5.29 Mil/uL (ref 4.22–5.81)
RDW: 12.1 % (ref 11.5–15.5)
WBC: 5.8 K/uL (ref 4.0–10.5)

## 2024-02-02 LAB — C-REACTIVE PROTEIN: CRP: <0.5 mg/dL (ref 0.5–20.0)

## 2024-02-02 LAB — SEDIMENTATION RATE: Sed Rate: 5 mm/h (ref 0–15)

## 2024-02-02 MED ORDER — CITALOPRAM HYDROBROMIDE 20 MG PO TABS: 20.0000 mg | ORAL_TABLET | Freq: Every day | ORAL | 3 refills | Status: DC

## 2024-02-02 NOTE — Progress Notes (Signed)
 OFFICE VISIT  02/02/2024  CC:  Chief Complaint  Patient presents with   Follow-up    Irritable bowel syndrome; worsened a little over 1 year ago, not currently taking any OTC meds.     Patient is a 33 y.o. male who presents for gastrointestinal symptoms.  HPI: For about 2 years Lyndy has had persistent postprandial diarrhea.  Getting worse for about the last year.  Up to 5 watery stools per day, almost immediately after he starts eating.  Feels like he is bloated all the time.  No abdominal cramping, no GERD, no nausea.  There is no blood in the stool. Changing diet does not make any difference in the symptoms.  Appetite is good. No unusual fevers, body aches, or weight loss. He got cholecystectomy in January of this year and feels like if anything the symptoms got a little bit worse after this.  He uses Imodium a couple times a week and this does help some. Trials of fiber supplement, Levsin , and probiotics have not made a difference.  He has significant anal itching, worse at night.  He uses over-the-counter hydrocortisone cream.  (PAST IMAGING:  CT abdomen pelvis with contrast on 10/28/2022: IMPRESSION: Bilateral punctate nephrolithiasis, no hydronephrosis. Normal appendix. Cholelithiasis. No acute findings.   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  MRI 11/17/16--L5/S1 DDD with herniation indenting thecal sac and bilat S1 nerve sheaths--no nerve compression.  ESI fall 2018 helpful for shortterm. Rpt 04/2017 helped x 7 mo.   Finger injury 01/2015   L long finger laceration; repaired in ED; followed up by [facility]--no apparent neurovascular injury.   Generalized anxiety  disorder 04/06/2013   IBS (irritable bowel syndrome)    Mild persistent asthma    Mononucleosis    2015   Nephrolithiasis 09/2011   ED visit; left 3mm ureteral stone with slight hydroureter, right sided nonobstructing stone.   Substance abuse (HCC)    Tobacco dependence 05/09/2013    Past Surgical History:  Procedure Laterality Date   LAPAROSCOPIC CHOLECYSTECTOMY     04/2023    Outpatient Medications Prior to Visit  Medication Sig Dispense Refill   albuterol  (VENTOLIN  HFA) 108 (90 Base) MCG/ACT inhaler TAKE 2 PUFFS BY MOUTH EVERY 6 HOURS AS NEEDED FOR WHEEZE OR SHORTNESS OF BREATH (Patient not taking: Reported on 02/02/2024) 8.5 each 0   ipratropium (ATROVENT ) 0.06 % nasal spray Place 2 sprays into both nostrils 4 (four) times daily. (Patient not taking: Reported on 02/02/2024) 15 mL 0   montelukast  (SINGULAIR ) 10 MG tablet TAKE 1 TABLET BY MOUTH AT BEDTIME. NEEDS OFFICE VISIT FOR MORE REFILLS. (Patient not taking: Reported on 02/02/2024) 90 tablet 3   ondansetron  (ZOFRAN -ODT) 4 MG disintegrating tablet Take 1 tablet (4 mg total) by mouth every 8 (eight) hours as needed for nausea or vomiting. (Patient not taking: Reported on 02/02/2024) 20 tablet 0   No facility-administered medications prior to visit.    No Known Allergies  Review of Systems  As per HPI  PE:    02/02/2024    8:02 AM 04/27/2023   10:04 AM 09/25/2022   11:14 AM  Vitals  with BMI  Height 5' 11.25  5' 11  Weight 167 lbs 178 lbs 10 oz 184 lbs 6 oz  BMI 23.12  25.73  Systolic 127 110 877  Diastolic 78 82 70  Pulse 65 83 77     Physical Exam  Gen: Alert, well appearing.  Patient is oriented to person, place, time, and situation. AFFECT: pleasant, lucid thought and speech. CV: RRR, no m/r/g.   LUNGS: CTA bilat, nonlabored resps, good aeration in all lung fields. ABD: soft, NT, ND, BS normal.  No hepatospenomegaly or mass.  No bruits. EXT: no clubbing or cyanosis.  no edema.  SKIN: no rash, pallor, or  jaundice  LABS:  Last CBC Lab Results  Component Value Date   WBC 8.1 09/25/2022   HGB 17.0 09/25/2022   HCT 51.5 09/25/2022   MCV 94.3 09/25/2022   RDW 13.3 09/25/2022   PLT 227.0 09/25/2022   Last metabolic panel Lab Results  Component Value Date   GLUCOSE 81 09/25/2022   NA 140 09/25/2022   K 4.2 09/25/2022   CL 102 09/25/2022   CO2 29 09/25/2022   BUN 15 09/25/2022   CREATININE 1.43 09/25/2022   GFR 65.88 09/25/2022   CALCIUM 9.2 09/25/2022   PROT 7.2 09/25/2022   ALBUMIN 4.4 09/25/2022   BILITOT 1.1 09/25/2022   ALKPHOS 45 09/25/2022   AST 24 09/25/2022   ALT 25 09/25/2022   Last thyroid  functions Lab Results  Component Value Date   TSH 1.50 05/06/2022   Lab Results  Component Value Date   ESRSEDRATE 4 06/14/2018   Lab Results  Component Value Date   CRP 4.5 04/28/2017   IMPRESSION AND PLAN:  #1 chronic bloating with postprandial diarrhea.  Gradually worsening. IBS most likely but will get stool studies and some labs. CBC, c-Met, sed rate, CRP, TTG IgA, IgA total. Stool eval for ova/parasites, C. difficile, and Giardia and crypto +fecal calprotectin. GI ref-> ordered today. No new meds prescribed today.  #2 GAD.  This has been relatively well-controlled without medication until all of the GI symptoms gradually got worse over the last year.  He citalopram  has helped in the past so we will restart this -->start citalopram  20 every day.  An After Visit Summary was printed and given to the patient.  FOLLOW UP: Return in about 4 weeks (around 03/01/2024) for f/u anx and GI.  Signed:  Gerlene Hockey, MD           02/02/2024

## 2024-02-03 ENCOUNTER — Ambulatory Visit: Payer: Self-pay | Admitting: Family Medicine

## 2024-02-03 MED ORDER — ALBENDAZOLE 200 MG PO TABS: ORAL_TABLET | ORAL | 0 refills | Status: DC

## 2024-02-04 LAB — TISSUE TRANSGLUTAMINASE, IGA: (tTG) Ab, IgA: <1 U/mL

## 2024-02-04 LAB — IGA: Immunoglobulin A: 222 mg/dL (ref 47–310)

## 2024-02-08 ENCOUNTER — Other Ambulatory Visit

## 2024-02-08 DIAGNOSIS — R140 Abdominal distension (gaseous): Secondary | ICD-10-CM | POA: Diagnosis not present

## 2024-02-08 DIAGNOSIS — R109 Unspecified abdominal pain: Secondary | ICD-10-CM | POA: Diagnosis not present

## 2024-02-08 DIAGNOSIS — K529 Noninfective gastroenteritis and colitis, unspecified: Secondary | ICD-10-CM | POA: Diagnosis not present

## 2024-02-10 LAB — CALPROTECTIN, FECAL: Calprotectin, Fecal: 9 ug/g (ref 0–120)

## 2024-02-12 LAB — OVA AND PARASITE EXAMINATION
CONCENTRATE RESULT:: NONE SEEN
MICRO NUMBER:: 17121769
SPECIMEN QUALITY:: ADEQUATE
TRICHROME RESULT:: NONE SEEN

## 2024-02-12 LAB — CLOSTRIDIUM DIFFICILE TOXIN B, QUALITATIVE, REAL-TIME PCR: Toxigenic C. Difficile by PCR: NOT DETECTED

## 2024-02-12 LAB — GIARDIA AND CRYPTOSPORIDIUM ANTIGEN PANEL
MICRO NUMBER:: 17121768
RESULT:: NOT DETECTED
SPECIMEN QUALITY:: ADEQUATE
Specimen Quality:: ADEQUATE
micro Number:: 17121767

## 2024-02-27 ENCOUNTER — Other Ambulatory Visit: Payer: Self-pay | Admitting: Family Medicine

## 2024-03-07 ENCOUNTER — Ambulatory Visit: Admitting: Family Medicine

## 2024-03-07 ENCOUNTER — Encounter: Payer: Self-pay | Admitting: Family Medicine

## 2024-03-07 VITALS — BP 138/85 | HR 84 | Temp 98.2°F | Wt 169.2 lb

## 2024-03-07 DIAGNOSIS — K580 Irritable bowel syndrome with diarrhea: Secondary | ICD-10-CM | POA: Diagnosis not present

## 2024-03-07 DIAGNOSIS — F411 Generalized anxiety disorder: Secondary | ICD-10-CM

## 2024-03-07 MED ORDER — HYOSCYAMINE SULFATE 0.125 MG SL SUBL
SUBLINGUAL_TABLET | SUBLINGUAL | 1 refills | Status: AC
Start: 2024-03-07 — End: ?

## 2024-03-07 MED ORDER — CITALOPRAM HYDROBROMIDE 20 MG PO TABS: 20.0000 mg | ORAL_TABLET | Freq: Every day | ORAL | 1 refills | Status: AC

## 2024-03-07 NOTE — Progress Notes (Signed)
 OFFICE VISIT  03/07/2024  CC:  Chief Complaint  Patient presents with   Medical Management of Chronic Issues    Patient is a 33 y.o. male who presents for follow-up diarrhea and anxiety. A/P as of last visit: #1 chronic bloating with postprandial diarrhea.  Gradually worsening. IBS most likely but will get stool studies and some labs. CBC, c-Met, sed rate, CRP, TTG IgA, IgA total. Stool eval for ova/parasites, C. difficile, and Giardia and crypto +fecal calprotectin. GI ref-> ordered today. No new meds prescribed today.   #2 GAD.  This has been relatively well-controlled without medication until all of the GI symptoms gradually got worse over the last year.  He citalopram  has helped in the past so we will restart this -->start citalopram  20 every day.  INTERIM HX: Last visit his labs showed a eosinophil percent of 11.8%.  I empirically treated him for a parasitic infection with albendazole.  All stool testing came back negative.  No change in bowel habits since I last saw him. He reports he never got contacted by GI.  He has the information to contact their office now.  Citalopram  is helping some with his generalized anxiety.  No acute concerns.  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  MRI 11/17/16--L5/S1 DDD with herniation indenting thecal sac and bilat S1 nerve sheaths--no nerve compression.  ESI fall 2018 helpful for shortterm. Rpt 04/2017 helped x 7 mo.   Finger injury 01/2015   L long finger laceration; repaired in ED; followed up by [facility]--no apparent neurovascular injury.   Generalized anxiety disorder 04/06/2013   IBS (irritable bowel syndrome)    Mild  persistent asthma    Mononucleosis    2015   Nephrolithiasis 09/2011   ED visit; left 3mm ureteral stone with slight hydroureter, right sided nonobstructing stone.   Substance abuse (HCC)    Tobacco dependence 05/09/2013    Past Surgical History:  Procedure Laterality Date   LAPAROSCOPIC CHOLECYSTECTOMY     04/2023    Outpatient Medications Prior to Visit  Medication Sig Dispense Refill   albuterol  (VENTOLIN  HFA) 108 (90 Base) MCG/ACT inhaler TAKE 2 PUFFS BY MOUTH EVERY 6 HOURS AS NEEDED FOR WHEEZE OR SHORTNESS OF BREATH (Patient not taking: Reported on 03/07/2024) 8.5 each 0   citalopram  (CELEXA ) 20 MG tablet Take 1 tablet (20 mg total) by mouth daily. 30 tablet 3   ipratropium (ATROVENT ) 0.06 % nasal spray Place 2 sprays into both nostrils 4 (four) times daily. (Patient not taking: Reported on 03/07/2024) 15 mL 0   montelukast  (SINGULAIR ) 10 MG tablet TAKE 1 TABLET BY MOUTH AT BEDTIME. NEEDS OFFICE VISIT FOR MORE REFILLS. (Patient not taking: Reported on 03/07/2024) 90 tablet 3   ondansetron  (ZOFRAN -ODT) 4 MG disintegrating tablet Take 1 tablet (4 mg total) by mouth every 8 (eight) hours as needed for nausea or vomiting. (Patient not taking: Reported on 03/07/2024) 20 tablet 0   albendazole (ALBENZA) 200 MG tablet 2 tabs p.o. x 1 dose.  Repeat in 2 weeks. 4 tablet 0   No facility-administered medications prior to visit.    No Known Allergies  Review  of Systems As per HPI  PE:    03/07/2024    8:33 AM 02/02/2024    8:02 AM 04/27/2023   10:04 AM  Vitals with BMI  Height  5' 11.25   Weight 169 lbs 3 oz 167 lbs 178 lbs 10 oz  BMI  23.12   Systolic 138 127 889  Diastolic 85 78 82  Pulse 84 65 83     Physical Exam  Gen: Alert, well appearing.  Patient is oriented to person, place, time, and situation. AFFECT: pleasant, lucid thought and speech. No further exam today  LABS:  Last CBC Lab Results  Component Value Date   WBC 5.8 02/02/2024   HGB 16.5 02/02/2024    HCT 48.9 02/02/2024   MCV 92.4 02/02/2024   RDW 12.1 02/02/2024   PLT 187.0 02/02/2024   Last metabolic panel Lab Results  Component Value Date   GLUCOSE 105 (H) 02/02/2024   NA 140 02/02/2024   K 4.8 02/02/2024   CL 102 02/02/2024   CO2 30 02/02/2024   BUN 17 02/02/2024   CREATININE 1.27 02/02/2024   GFR 75.24 02/02/2024   CALCIUM 9.8 02/02/2024   PROT 7.2 02/02/2024   ALBUMIN 5.0 02/02/2024   BILITOT 1.0 02/02/2024   ALKPHOS 46 02/02/2024   AST 21 02/02/2024   ALT 36 02/02/2024     Lab Results  Component Value Date   ESRSEDRATE 5 02/02/2024   Lab Results  Component Value Date   CRP <0.5 02/02/2024   IMPRESSION AND PLAN:  #1 IBS. Referral to GI pending. Levsin  trial, 0.125 tabs, 1-2 before every meal 3 times daily as needed.  2.  GAD, improved with citalopram  20 mg a day. Refill today.  An After Visit Summary was printed and given to the patient.  FOLLOW UP: No follow-ups on file.  Signed:  Gerlene Hockey, MD           03/07/2024

## 2024-04-06 ENCOUNTER — Ambulatory Visit: Admitting: Physician Assistant

## 2024-04-06 ENCOUNTER — Encounter: Payer: Self-pay | Admitting: Family Medicine

## 2024-04-06 ENCOUNTER — Encounter: Payer: Self-pay | Admitting: Physician Assistant

## 2024-04-06 VITALS — BP 114/62 | HR 56 | Ht 70.5 in | Wt 166.2 lb

## 2024-04-06 DIAGNOSIS — R634 Abnormal weight loss: Secondary | ICD-10-CM

## 2024-04-06 DIAGNOSIS — K529 Noninfective gastroenteritis and colitis, unspecified: Secondary | ICD-10-CM

## 2024-04-06 DIAGNOSIS — R197 Diarrhea, unspecified: Secondary | ICD-10-CM

## 2024-04-06 MED ORDER — COLESTIPOL HCL 1 G PO TABS: 2.0000 g | ORAL_TABLET | Freq: Two times a day (BID) | ORAL | 3 refills | Status: DC

## 2024-04-06 MED ORDER — NA SULFATE-K SULFATE-MG SULF 17.5-3.13-1.6 GM/177ML PO SOLN: 1.0000 | Freq: Once | ORAL | 0 refills | Status: AC

## 2024-04-06 NOTE — Patient Instructions (Addendum)
 We have sent the following medications to your pharmacy for you to pick up at your convenience: Colestid .   Please stop Colestid  one week prior to your colonoscopy.    You have been scheduled for a colonoscopy. Please follow written instructions given to you at your visit today.   If you use inhalers (even only as needed), please bring them with you on the day of your procedure.  DO NOT TAKE 7 DAYS PRIOR TO TEST- Trulicity (dulaglutide) Ozempic, Wegovy (semaglutide) Mounjaro, Zepbound (tirzepatide) Bydureon Bcise (exanatide extended release)  DO NOT TAKE 1 DAY PRIOR TO YOUR TEST Rybelsus (semaglutide) Adlyxin (lixisenatide) Victoza (liraglutide) Byetta (exanatide) _____________________________________________________________   If your blood pressure at your visit was 140/90 or greater, please contact your primary care physician to follow up on this.  _______________________________________________________  If you are age 1 or older, your body mass index should be between 23-30. Your Body mass index is 23.52 kg/m. If this is out of the aforementioned range listed, please consider follow up with your Primary Care Provider.  If you are age 76 or younger, your body mass index should be between 19-25. Your Body mass index is 23.52 kg/m. If this is out of the aformentioned range listed, please consider follow up with your Primary Care Provider.   ________________________________________________________  The [facility] providers would like to encourage you to use MYCHART to communicate with providers for non-urgent requests or questions.  Due to long hold times on the telephone, sending your provider a message by [facility] may be a faster and more efficient way to get a response.  Please allow 48 business hours for a response.  Please remember that this is for non-urgent requests.  _______________________________________________________  [facility] is using a team-based  approach to care.  Your team is made up of your doctor and two to three APPS. Our APPS (Nurse Practitioners and Physician Assistants) work with your physician to ensure care continuity for you. They are fully qualified to address your health concerns and develop a treatment plan. They communicate directly with your gastroenterologist to care for you. Seeing the Advanced Practice Practitioners on your physician's team can help you by facilitating care more promptly, often allowing for earlier appointments, access to diagnostic testing, procedures, and other specialty referrals.

## 2024-04-06 NOTE — Progress Notes (Signed)
 Brian Console, PA-C [address] [place], [place]  [address] Phone: [phone]   Gastroenterology Consultation  Referring Provider:     Candise Aleene DEL, MD Primary Care Physician:  Candise Aleene DEL, MD Primary Gastroenterologist:  Brian Console, PA-C / Norleen Kiang, MD  Reason for Consultation:    Diarrhea        HPI:   Discussed the use of AI scribe software for clinical note transcription with the patient, who gave verbal consent to proceed.  New patient.  Referred to our office by Dr. Helon at [facility].  Here to evaluate postprandial abdominal pain and diarrhea ongoing for 2 years.  Up to 5 watery stools per day, immediately after eating.  Feels bloated.  Denies rectal bleeding.  No previous colonoscopy or GI evaluation.  Medical history significant for anxiety disorder.  Recently started citalopram  20 mg daily.  Cholecystectomy 04/2023.  Diarrhea worsened after that.  Tried Imodium with some benefit.   Also tried fiber supplement, Levsin , and probiotics with little benefit.  He has lost 11 pounds in the past 9 months.  Has also had anal itching, worse at night.  Uses OTC hydrocortisone cream.  10/2022 CT abdomen pelvis with contrast: Bilateral punctate nephrolithiasis, no hydronephrosis. Normal appendix.  Cholelithiasis.  No acute findings  01/2024 labs: Negative celiac.  Normal CBC, CMP, CRP, sed rate.  01/2024 stool test: Normal fecal calprotectin.  Negative ova and parasite.  Negative Giardia and Cryptosporidium.  Negative C. difficile toxin PCR. History of Present Illness Brian Mills. is a 33 year old male who presents with chronic diarrhea.  Diarrhea - Constant diarrhea for approximately 2 - 3 years - Symptoms worsened following cholecystectomy in January 2025 - Diarrhea occurs regardless of time of day or dietary intake, though some triggers identified - Urgent need to defecate immediately after eating, including after a few bites of  breakfast - No nocturnal diarrhea - No blood in stool  Abdominal symptoms - No abdominal pain - Frequent bloating  Weight loss - Unintentional weight loss of approximately 11 pounds over the past nine months - Continues to eat frequently - No nausea or vomiting.  Impact on quality of life - Significant anxiety about leaving the house due to unpredictable diarrhea - Requires knowledge of nearby bathroom locations  Prior evaluation and treatment - CT scan in July 2024 showed tiny kidney stones, no intestinal inflammation - Blood tests for celiac disease negative - Stool studies negative for inflammatory bowel disease, ova, parasites, Giardia, cryptosporidium, and C. diff - Hyoscyamine  previously trialed for irritable bowel syndrome, ineffective - Not currently taking Imodium  Risk factors and exposures - No recent antibiotic use - No recent travel outside the country except for a cruise to the [place] in October of the previous year - Drinks city water     Past Medical History:  Diagnosis Date   Acne vulgaris 11/16/2012   Adapalene  -benz perox helped clear this up    Allergic rhinitis    Allergy    Cervical radiculopathy at C6 02/2014   Left C6-C7 (NCS and EMG at [facility] for left arm paresthesias)   Cholelithiasis    asymptomatic   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  MRI 11/17/16--L5/S1 DDD with herniation indenting thecal sac and bilat S1 nerve sheaths--no nerve compression.  ESI fall 2018 helpful for shortterm. Rpt 04/2017 helped x 7 mo.   Finger injury 01/2015   L long finger  laceration; repaired in ED; followed up by [facility]--no apparent neurovascular injury.   Generalized anxiety disorder 04/06/2013   IBS (irritable bowel syndrome)    Mild persistent asthma    Mononucleosis    2015   Nephrolithiasis 09/2011   ED visit; left 3mm ureteral stone with slight hydroureter, right sided nonobstructing stone.   Substance abuse (HCC)    Tobacco  dependence 05/09/2013    Past Surgical History:  Procedure Laterality Date   LAPAROSCOPIC CHOLECYSTECTOMY     04/2023    Prior to Admission medications  Medication Sig Start Date End Date Taking? Authorizing Provider  albuterol  (VENTOLIN  HFA) 108 (90 Base) MCG/ACT inhaler TAKE 2 PUFFS BY MOUTH EVERY 6 HOURS AS NEEDED FOR WHEEZE OR SHORTNESS OF BREATH Patient not taking: Reported on 03/07/2024 04/22/22   McGowen, Aleene DEL, MD  citalopram  (CELEXA ) 20 MG tablet Take 1 tablet (20 mg total) by mouth daily. 03/07/24   McGowen, Aleene DEL, MD  hyoscyamine  (LEVSIN  SL) 0.125 MG SL tablet 1-2 qAC tid prn 03/07/24   McGowen, Aleene DEL, MD  ipratropium (ATROVENT ) 0.06 % nasal spray Place 2 sprays into both nostrils 4 (four) times daily. Patient not taking: Reported on 03/07/2024 03/25/22   Catherine Fuller A, DO  montelukast  (SINGULAIR ) 10 MG tablet TAKE 1 TABLET BY MOUTH AT BEDTIME. NEEDS OFFICE VISIT FOR MORE REFILLS. Patient not taking: Reported on 03/07/2024 05/14/22   McGowen, Philip H, MD  ondansetron  (ZOFRAN -ODT) 4 MG disintegrating tablet Take 1 tablet (4 mg total) by mouth every 8 (eight) hours as needed for nausea or vomiting. Patient not taking: Reported on 03/07/2024 04/27/23   Catherine Fuller LABOR, DO    Family History  Problem Relation Age of Onset   Arthritis Mother    Asthma Mother    Hypertension Maternal Grandfather    Arthritis Maternal Grandfather    Depression Paternal Grandmother    Hypertension Paternal Grandmother      Social History[1]  Allergies as of 04/06/2024   (No Known Allergies)    Review of Systems:    All systems reviewed and negative except where noted in HPI.   Physical Exam:  BP 114/62 (BP Location: Right Arm, Patient Position: Sitting, Cuff Size: Normal)   Pulse (!) 56   Ht 5' 10.5 (1.791 m)   Wt 166 lb 4 oz (75.4 kg)   BMI 23.52 kg/m  No LMP for male patient.  General:   Alert,  Well-developed, well-nourished, pleasant and cooperative in NAD Lungs:   Respirations even and unlabored.  Clear throughout to auscultation.   No wheezes, crackles, or rhonchi. No acute distress. Heart:  Regular rate and rhythm; no murmurs, clicks, rubs, or gallops. Abdomen:  Normal bowel sounds.  No bruits.  Soft, and non-distended without masses, hepatosplenomegaly or hernias noted.  No Tenderness.  No guarding or rebound tenderness.    Neurologic:  Alert and oriented x3;  grossly normal neurologically. Psych:  Alert and cooperative. Normal mood and affect.   Imaging Studies: No results found.  Labs: CBC    Component Value Date/Time   WBC 5.8 02/02/2024 0829   RBC 5.29 02/02/2024 0829   HGB 16.5 02/02/2024 0829   HCT 48.9 02/02/2024 0829   PLT 187.0 02/02/2024 0829   MCV 92.4 02/02/2024 0829    CMP     Component Value Date/Time   NA 140 02/02/2024 0829   K 4.8 02/02/2024 0829   CL 102 02/02/2024 0829   CO2 30 02/02/2024 0829   GLUCOSE 105 (  H) 02/02/2024 0829   BUN 17 02/02/2024 0829   CREATININE 1.27 02/02/2024 0829   CALCIUM 9.8 02/02/2024 0829   PROT 7.2 02/02/2024 0829   ALBUMIN 5.0 02/02/2024 0829   AST 21 02/02/2024 0829   ALT 36 02/02/2024 0829   ALKPHOS 46 02/02/2024 0829   BILITOT 1.0 02/02/2024 0829    Assessment and Plan:   Anthoney Sheppard. is a 33 y.o. y/o male has been referred for:  1.  Chronic diarrhea 2.  Weight loss  Differential diagnosis includes irritable bowel syndrome, microscopic colitis, and bile salt diarrhea postcholecystectomy (04/2023).  Recent stool test showed normal fecal calprotectin, no evidence of IBD.  C. difficile stool test negative.  Stool test also negative for ova, parasites, Giardia, Cryptosporidium.  CBC and CMP labs normal.  Celiac labs negative.  Plan: - Scheduling Colonoscopy: Evaluate for microscopic colitis I discussed risks of colonoscopy with patient to include risk of bleeding, colon perforation, and risk of sedation.  Patient expressed understanding and agrees to proceed with  colonoscopy.  - Rx Colestid  1g, Take 2 tablets Twice daily with large glass of water - Advised adequate fluid intake. - Reduce Colestid  to 2 tablets daily if constipation occurs. - Discontinue Colestid  one week before colonoscopy to ensure adequate prep for procedure.   Follow up 4 weeks after colonoscopy with TG.  Brian Console, PA-C       [1]  Social History Tobacco Use   Smoking status: Former    Current packs/day: 0.25    Types: Cigarettes   Smokeless tobacco: Former    Types: Engineer, Drilling   Vaping status: Every Day  Substance Use Topics   Alcohol use: Yes    Alcohol/week: 0.0 standard drinks of alcohol    Comment: Occasionally   Drug use: No

## 2024-04-07 NOTE — Telephone Encounter (Signed)
 No further action needed at this time.

## 2024-04-07 NOTE — Progress Notes (Signed)
 Noted

## 2024-04-11 ENCOUNTER — Encounter: Payer: Self-pay | Admitting: Internal Medicine

## 2024-04-19 ENCOUNTER — Ambulatory Visit: Admitting: Internal Medicine

## 2024-04-19 ENCOUNTER — Encounter: Payer: Self-pay | Admitting: Internal Medicine

## 2024-04-19 VITALS — BP 120/54 | HR 81 | Temp 98.1°F | Resp 17 | Ht 70.5 in | Wt 166.0 lb

## 2024-04-19 DIAGNOSIS — R634 Abnormal weight loss: Secondary | ICD-10-CM | POA: Diagnosis not present

## 2024-04-19 DIAGNOSIS — K529 Noninfective gastroenteritis and colitis, unspecified: Secondary | ICD-10-CM

## 2024-04-19 DIAGNOSIS — R197 Diarrhea, unspecified: Secondary | ICD-10-CM | POA: Diagnosis not present

## 2024-04-19 MED ORDER — SODIUM CHLORIDE 0.9 % IV SOLN: 500.0000 mL | Freq: Once | INTRAVENOUS | Status: DC

## 2024-04-19 NOTE — Progress Notes (Signed)
 Expand All Collapse All       [name], PA-C [address] [place], [place]  [address] Phone: [phone]   Gastroenterology Consultation   Referring Provider:     Candise Aleene DEL, MD Primary Care Physician:  Candise Aleene DEL, MD Primary Gastroenterologist:  [name], PA-C / Norleen Kiang, MD  Reason for Consultation:    Diarrhea        HPI:   Discussed the use of AI scribe software for clinical note transcription with the patient, who gave verbal consent to proceed.   New patient.  Referred to our office by Dr. Helon at [facility].  Here to evaluate postprandial abdominal pain and diarrhea ongoing for 2 years.  Up to 5 watery stools per day, immediately after eating.  Feels bloated.  Denies rectal bleeding.  No previous colonoscopy or GI evaluation.  Medical history significant for anxiety disorder.  Recently started citalopram  20 mg daily.   Cholecystectomy 04/2023.  Diarrhea worsened after that.  Tried Imodium with some benefit.   Also tried fiber supplement, Levsin , and probiotics with little benefit.  He has lost 11 pounds in the past 9 months.   Has also had anal itching, worse at night.  Uses OTC hydrocortisone cream.   10/2022 CT abdomen pelvis with contrast: Bilateral punctate nephrolithiasis, no hydronephrosis. Normal appendix.  Cholelithiasis.  No acute findings   01/2024 labs: Negative celiac.  Normal CBC, CMP, CRP, sed rate.   01/2024 stool test: Normal fecal calprotectin.  Negative ova and parasite.  Negative Giardia and Cryptosporidium.  Negative C. difficile toxin PCR. History of Present Illness Brian Mills. is a 33 year old male who presents with chronic diarrhea.   Diarrhea - Constant diarrhea for approximately 2 - 3 years - Symptoms worsened following cholecystectomy in January 2025 - Diarrhea occurs regardless of time of day or dietary intake, though some triggers identified - Urgent need to defecate immediately after eating,  including after a few bites of breakfast - No nocturnal diarrhea - No blood in stool   Abdominal symptoms - No abdominal pain - Frequent bloating   Weight loss - Unintentional weight loss of approximately 11 pounds over the past nine months - Continues to eat frequently - No nausea or vomiting.   Impact on quality of life - Significant anxiety about leaving the house due to unpredictable diarrhea - Requires knowledge of nearby bathroom locations   Prior evaluation and treatment - CT scan in July 2024 showed tiny kidney stones, no intestinal inflammation - Blood tests for celiac disease negative - Stool studies negative for inflammatory bowel disease, ova, parasites, Giardia, cryptosporidium, and C. diff - Hyoscyamine  previously trialed for irritable bowel syndrome, ineffective - Not currently taking Imodium   Risk factors and exposures - No recent antibiotic use - No recent travel outside the country except for a cruise to the [place] in October of the previous year - Drinks city water           Past Medical History:  Diagnosis Date   Acne vulgaris 11/16/2012    Adapalene  -benz perox helped clear this up    Allergic rhinitis     Allergy     Cervical radiculopathy at C6 02/2014    Left C6-C7 (NCS and EMG at [facility] for left arm paresthesias)   Cholelithiasis      asymptomatic   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  MRI 11/17/16--L5/S1 DDD with herniation indenting  thecal sac and bilat S1 nerve sheaths--no nerve compression.  ESI fall 2018 helpful for shortterm. Rpt 04/2017 helped x 7 mo.   Finger injury 01/2015    L long finger laceration; repaired in ED; followed up by [facility]--no apparent neurovascular injury.   Generalized anxiety disorder 04/06/2013   IBS (irritable bowel syndrome)     Mild persistent asthma     Mononucleosis      2015   Nephrolithiasis 09/2011    ED visit; left 3mm ureteral stone with slight hydroureter, right sided  nonobstructing stone.   Substance abuse (HCC)     Tobacco dependence 05/09/2013               Past Surgical History:  Procedure Laterality Date   LAPAROSCOPIC CHOLECYSTECTOMY        04/2023                 Prior to Admission medications  Medication Sig Start Date End Date Taking? Authorizing Provider  albuterol  (VENTOLIN  HFA) 108 (90 Base) MCG/ACT inhaler TAKE 2 PUFFS BY MOUTH EVERY 6 HOURS AS NEEDED FOR WHEEZE OR SHORTNESS OF BREATH Patient not taking: Reported on 03/07/2024 04/22/22     McGowen, Philip H, MD  citalopram  (CELEXA ) 20 MG tablet Take 1 tablet (20 mg total) by mouth daily. 03/07/24     McGowen, Aleene DEL, MD  hyoscyamine  (LEVSIN  SL) 0.125 MG SL tablet 1-2 qAC tid prn 03/07/24     McGowen, Aleene DEL, MD  ipratropium (ATROVENT ) 0.06 % nasal spray Place 2 sprays into both nostrils 4 (four) times daily. Patient not taking: Reported on 03/07/2024 03/25/22     Catherine Fuller A, DO  montelukast  (SINGULAIR ) 10 MG tablet TAKE 1 TABLET BY MOUTH AT BEDTIME. NEEDS OFFICE VISIT FOR MORE REFILLS. Patient not taking: Reported on 03/07/2024 05/14/22     McGowen, Philip H, MD  ondansetron  (ZOFRAN -ODT) 4 MG disintegrating tablet Take 1 tablet (4 mg total) by mouth every 8 (eight) hours as needed for nausea or vomiting. Patient not taking: Reported on 03/07/2024 04/27/23     Catherine Fuller LABOR, DO           Family History  Problem Relation Age of Onset   Arthritis Mother     Asthma Mother     Hypertension Maternal Grandfather     Arthritis Maternal Grandfather     Depression Paternal Grandmother     Hypertension Paternal Grandmother            [Social History]  [Social History]      Tobacco Use   Smoking status: Former      Current packs/day: 0.25      Types: Cigarettes   Smokeless tobacco: Former      Types: Engineer, Drilling   Vaping status: Every Day  Substance Use Topics   Alcohol use: Yes      Alcohol/week: 0.0 standard drinks of alcohol      Comment: Occasionally   Drug  use: No        Allergies as of 04/06/2024   (No Known Allergies)      Review of Systems:    All systems reviewed and negative except where noted in HPI.    Physical Exam:  BP 114/62 (BP Location: Right Arm, Patient Position: Sitting, Cuff Size: Normal)   Pulse (!) 56   Ht 5' 10.5 (1.791 m)   Wt 166 lb 4 oz (75.4 kg)   BMI 23.52 kg/m  No LMP for  male patient.   General:   Alert,  Well-developed, well-nourished, pleasant and cooperative in NAD Lungs:  Respirations even and unlabored.  Clear throughout to auscultation.   No wheezes, crackles, or rhonchi. No acute distress. Heart:  Regular rate and rhythm; no murmurs, clicks, rubs, or gallops. Abdomen:  Normal bowel sounds.  No bruits.  Soft, and non-distended without masses, hepatosplenomegaly or hernias noted.  No Tenderness.  No guarding or rebound tenderness.    Neurologic:  Alert and oriented x3;  grossly normal neurologically. Psych:  Alert and cooperative. Normal mood and affect.     Imaging Studies: Imaging Results  No results found.     Labs: CBC Labs (Brief)          Component Value Date/Time    WBC 5.8 02/02/2024 0829    RBC 5.29 02/02/2024 0829    HGB 16.5 02/02/2024 0829    HCT 48.9 02/02/2024 0829    PLT 187.0 02/02/2024 0829    MCV 92.4 02/02/2024 0829        CMP     Labs (Brief)          Component Value Date/Time    NA 140 02/02/2024 0829    K 4.8 02/02/2024 0829    CL 102 02/02/2024 0829    CO2 30 02/02/2024 0829    GLUCOSE 105 (H) 02/02/2024 0829    BUN 17 02/02/2024 0829    CREATININE 1.27 02/02/2024 0829    CALCIUM 9.8 02/02/2024 0829    PROT 7.2 02/02/2024 0829    ALBUMIN 5.0 02/02/2024 0829    AST 21 02/02/2024 0829    ALT 36 02/02/2024 0829    ALKPHOS 46 02/02/2024 0829    BILITOT 1.0 02/02/2024 0829        Assessment and Plan:    Brian Mills. is a 33 y.o. y/o male has been referred for:   1.  Chronic diarrhea 2.  Weight loss   Differential diagnosis includes  irritable bowel syndrome, microscopic colitis, and bile salt diarrhea postcholecystectomy (04/2023).  Recent stool test showed normal fecal calprotectin, no evidence of IBD.  C. difficile stool test negative.  Stool test also negative for ova, parasites, Giardia, Cryptosporidium.  CBC and CMP labs normal.  Celiac labs negative.   Plan: - Scheduling Colonoscopy: Evaluate for microscopic colitis I discussed risks of colonoscopy with patient to include risk of bleeding, colon perforation, and risk of sedation.  Patient expressed understanding and agrees to proceed with colonoscopy.  - Rx Colestid  1g, Take 2 tablets Twice daily with large glass of water - Advised adequate fluid intake. - Reduce Colestid  to 2 tablets daily if constipation occurs. - Discontinue Colestid  one week before colonoscopy to ensure adequate prep for procedure.     Follow up 4 weeks after colonoscopy with TG.   [name], PA-C

## 2024-04-19 NOTE — Progress Notes (Deleted)
 Transferred to PACU via stretcher.  Not responding to stimulation at this time.  VSS upon leaving procedure room.

## 2024-04-19 NOTE — Op Note (Signed)
 [facility] Patient Name: Brian Mills Procedure Date: 04/19/2024 3:39 PM MRN: [id number] Endoscopist: Norleen SAILOR. [name] , MD, [id number] Age: 33 Referring MD:  Date of Birth: 1990/07/07 Gender: Male Account #: [id number] Procedure:                Colonoscopy with biopsies Indications:              Chronic diarrhea, weight loss. Seems to responded                            to Colestid  suggesting bile salt related diarrhea Medicines:                Monitored Anesthesia Care Procedure:                Pre-Anesthesia Assessment:                           - Prior to the procedure, a History and Physical                            was performed, and patient medications and                            allergies were reviewed. The patient's tolerance of                            previous anesthesia was also reviewed. The risks                            and benefits of the procedure and the sedation                            options and risks were discussed with the patient.                            All questions were answered, and informed consent                            was obtained. Prior Anticoagulants: The patient has                            taken no anticoagulant or antiplatelet agents. ASA                            Grade Assessment: II - A patient with mild systemic                            disease. After reviewing the risks and benefits,                            the patient was deemed in satisfactory condition to                            undergo the procedure.  After obtaining informed consent, the colonoscope                            was passed under direct vision. Throughout the                            procedure, the patient's blood pressure, pulse, and                            oxygen saturations were monitored continuously. The                            CF [id number] #[id number] was introduced through the anus                             and advanced to the the cecum, identified by                            appendiceal orifice and ileocecal valve. The                            terminal ileum, ileocecal valve, appendiceal                            orifice, and rectum were photographed. The quality                            of the bowel preparation was excellent. The                            colonoscopy was performed without difficulty. The                            patient tolerated the procedure well. The bowel                            preparation used was SUPREP via split dose                            instruction. Scope In: 3:45:04 PM Scope Out: 3:55:35 PM Scope Withdrawal Time: 0 hours 6 minutes 48 seconds  Total Procedure Duration: 0 hours 10 minutes 31 seconds  Findings:                 The terminal ileum appeared normal.                           The entire examined colon appeared normal. No                            retroflexion due to narrow vault. Excellent view                            from anal os image.. Biopsies for histology were  taken with a cold forceps from the entire colon for                            evaluation of microscopic colitis. Complications:            No immediate complications. Estimated blood loss:                            None. Estimated Blood Loss:     Estimated blood loss: none. Impression:               - The examined portion of the ileum was normal.                           - The entire examined colon is. Biopsies taken to                            rule out microscopic colitis. Recommendation:           - Repeat colonoscopy at age 83 for screening                            purposes.                           - Patient has a contact number available for                            emergencies. The signs and symptoms of potential                            delayed complications were discussed with the                             patient. Return to normal activities tomorrow.                            Written discharge instructions were provided to the                            patient.                           - Resume previous diet.                           - Continue present medications. Continue Colestid                            - Await pathology results.                           - Office follow-up with [name], PA-C in about                            4 weeks Norleen SAILOR. [name], MD 04/19/2024 4:01:23 PM This report has been signed  electronically.

## 2024-04-19 NOTE — Patient Instructions (Signed)
" ° °  Await results of colon biopsies done  Continue previous diet & medications  Continue Colestid    Follow up in 4 weeks with [name] PA-C- see page 2 for date & time   Repeat Colonoscopy @ age 33 for screening purposes     YOU HAD AN ENDOSCOPIC PROCEDURE TODAY AT THE [facility]:   Refer to the procedure report that was given to you for any specific questions about what was found during the examination.  If the procedure report does not answer your questions, please call your gastroenterologist to clarify.  If you requested that your care partner not be given the details of your procedure findings, then the procedure report has been included in a sealed envelope for you to review at your convenience later.  YOU SHOULD EXPECT: Some feelings of bloating in the abdomen. Passage of more gas than usual.  Walking can help get rid of the air that was put into your GI tract during the procedure and reduce the bloating. If you had a lower endoscopy (such as a colonoscopy or flexible sigmoidoscopy) you may notice spotting of blood in your stool or on the toilet paper. If you underwent a bowel prep for your procedure, you may not have a normal bowel movement for a few days.  Please Note:  You might notice some irritation and congestion in your nose or some drainage.  This is from the oxygen used during your procedure.  There is no need for concern and it should clear up in a day or so.  SYMPTOMS TO REPORT IMMEDIATELY:  Following lower endoscopy (colonoscopy or flexible sigmoidoscopy):  Excessive amounts of blood in the stool  Significant tenderness or worsening of abdominal pains  Swelling of the abdomen that is new, acute  Fever of 100F or higher   For urgent or emergent issues, a gastroenterologist can be reached at any hour by calling [phone]. Do not use MyChart messaging for urgent concerns.    DIET:  We do recommend a small meal at first, but then you may proceed  to your regular diet.  Drink plenty of fluids but you should avoid alcoholic beverages for 24 hours.  ACTIVITY:  You should plan to take it easy for the rest of today and you should NOT DRIVE or use heavy machinery until tomorrow (because of the sedation medicines used during the test).    FOLLOW UP: Our staff will call the number listed on your records the next business day following your procedure.  We will call around 7:15- 8:00 am to check on you and address any questions or concerns that you may have regarding the information given to you following your procedure. If we do not reach you, we will leave a message.     If any biopsies were taken you will be contacted by phone or by letter within the next 1-3 weeks.  Please call us  at [phone] if you have not heard about the biopsies in 3 weeks.    SIGNATURES/CONFIDENTIALITY: You and/or your care partner have signed paperwork which will be entered into your electronic medical record.  These signatures attest to the fact that that the information above on your After Visit Summary has been reviewed and is understood.  Full responsibility of the confidentiality of this discharge information lies with you and/or your care-partner. "

## 2024-04-19 NOTE — Progress Notes (Signed)
 Transferred to PACU via stretcher.  Not responding to stimulation at this time.  VSS upon leaving procedure room.

## 2024-04-19 NOTE — Progress Notes (Signed)
 Called to room to assist during endoscopic procedure.  Patient ID and intended procedure confirmed with present staff. Received instructions for my participation in the procedure from the performing physician.

## 2024-04-22 ENCOUNTER — Telehealth: Payer: Self-pay

## 2024-04-22 ENCOUNTER — Ambulatory Visit: Payer: Self-pay | Admitting: Internal Medicine

## 2024-04-22 LAB — SURGICAL PATHOLOGY

## 2024-04-22 NOTE — Telephone Encounter (Signed)
" °  Follow up Call-     04/19/2024    3:16 PM  Call back number  Post procedure Call Back phone  # [phone]  Permission to leave phone message Yes     Patient questions:  Do you have a fever, pain , or abdominal swelling? No. Pain Score  0 *  Have you tolerated food without any problems? Yes.    Have you been able to return to your normal activities? Yes.    Do you have any questions about your discharge instructions: Diet   No. Medications  No. Follow up visit  No.  Do you have questions or concerns about your Care? No.  Actions: * If pain score is 4 or above: No action needed, pain <4.   "

## 2024-05-01 ENCOUNTER — Other Ambulatory Visit: Payer: Self-pay | Admitting: Physician Assistant

## 2024-05-01 DIAGNOSIS — R197 Diarrhea, unspecified: Secondary | ICD-10-CM

## 2024-05-20 ENCOUNTER — Ambulatory Visit: Admitting: Physician Assistant

## 2024-05-25 ENCOUNTER — Ambulatory Visit: Admitting: Physician Assistant
